# Patient Record
Sex: Male | Born: 1970 | Race: Black or African American | Hispanic: No | Marital: Single | Smoking: Current every day smoker
Health system: Southern US, Community
[De-identification: ages and names within clinical notes are randomized; demographics above are authoritative.]

## PROBLEM LIST (undated history)

## (undated) DIAGNOSIS — E7800 Pure hypercholesterolemia, unspecified: Secondary | ICD-10-CM

## (undated) DIAGNOSIS — E119 Type 2 diabetes mellitus without complications: Secondary | ICD-10-CM

## (undated) DIAGNOSIS — I10 Essential (primary) hypertension: Secondary | ICD-10-CM

## (undated) HISTORY — PX: TONSILLECTOMY: SUR1361

---

## 1999-10-03 ENCOUNTER — Emergency Department (HOSPITAL_COMMUNITY): Admission: EM | Admit: 1999-10-03 | Discharge: 1999-10-03 | Payer: Self-pay

## 1999-10-18 ENCOUNTER — Encounter: Admission: RE | Admit: 1999-10-18 | Discharge: 2000-01-16 | Payer: Self-pay | Admitting: Internal Medicine

## 2005-01-28 ENCOUNTER — Ambulatory Visit: Payer: Self-pay | Admitting: Internal Medicine

## 2005-03-01 ENCOUNTER — Ambulatory Visit: Payer: Self-pay | Admitting: Internal Medicine

## 2005-04-06 ENCOUNTER — Ambulatory Visit: Payer: Self-pay | Admitting: Internal Medicine

## 2005-07-05 ENCOUNTER — Ambulatory Visit: Payer: Self-pay | Admitting: Internal Medicine

## 2005-07-08 ENCOUNTER — Ambulatory Visit: Payer: Self-pay | Admitting: Internal Medicine

## 2005-09-19 ENCOUNTER — Ambulatory Visit: Payer: Self-pay | Admitting: Internal Medicine

## 2006-07-20 ENCOUNTER — Ambulatory Visit: Payer: Self-pay | Admitting: Internal Medicine

## 2007-03-16 DIAGNOSIS — R519 Headache, unspecified: Secondary | ICD-10-CM | POA: Insufficient documentation

## 2007-03-16 DIAGNOSIS — R51 Headache: Secondary | ICD-10-CM

## 2007-03-16 DIAGNOSIS — E119 Type 2 diabetes mellitus without complications: Secondary | ICD-10-CM

## 2007-03-16 DIAGNOSIS — M545 Low back pain: Secondary | ICD-10-CM | POA: Insufficient documentation

## 2007-03-16 DIAGNOSIS — I10 Essential (primary) hypertension: Secondary | ICD-10-CM | POA: Insufficient documentation

## 2007-03-29 ENCOUNTER — Emergency Department (HOSPITAL_COMMUNITY): Admission: EM | Admit: 2007-03-29 | Discharge: 2007-03-29 | Payer: Self-pay | Admitting: Emergency Medicine

## 2007-05-08 ENCOUNTER — Ambulatory Visit: Payer: Self-pay | Admitting: Internal Medicine

## 2007-05-08 LAB — CONVERTED CEMR LAB: Uric Acid, Serum: 9.1 mg/dL — ABNORMAL HIGH (ref 2.4–7.0)

## 2007-06-29 ENCOUNTER — Ambulatory Visit: Payer: Self-pay | Admitting: Internal Medicine

## 2007-06-29 DIAGNOSIS — F411 Generalized anxiety disorder: Secondary | ICD-10-CM | POA: Insufficient documentation

## 2007-06-29 DIAGNOSIS — J069 Acute upper respiratory infection, unspecified: Secondary | ICD-10-CM | POA: Insufficient documentation

## 2007-06-29 DIAGNOSIS — M109 Gout, unspecified: Secondary | ICD-10-CM | POA: Insufficient documentation

## 2007-06-29 LAB — CONVERTED CEMR LAB: Blood Glucose, Fingerstick: 224

## 2007-08-03 ENCOUNTER — Telehealth: Payer: Self-pay | Admitting: Internal Medicine

## 2007-08-27 ENCOUNTER — Telehealth: Payer: Self-pay | Admitting: Internal Medicine

## 2008-01-03 ENCOUNTER — Telehealth: Payer: Self-pay | Admitting: Internal Medicine

## 2008-03-20 ENCOUNTER — Ambulatory Visit: Payer: Self-pay | Admitting: Internal Medicine

## 2008-03-20 DIAGNOSIS — F17200 Nicotine dependence, unspecified, uncomplicated: Secondary | ICD-10-CM | POA: Insufficient documentation

## 2008-03-20 LAB — CONVERTED CEMR LAB: Blood Glucose, Fingerstick: 94

## 2008-04-16 ENCOUNTER — Ambulatory Visit: Payer: Self-pay | Admitting: Internal Medicine

## 2008-04-16 DIAGNOSIS — R55 Syncope and collapse: Secondary | ICD-10-CM

## 2008-04-16 LAB — CONVERTED CEMR LAB: Blood Glucose, Fingerstick: 186

## 2008-06-16 ENCOUNTER — Telehealth: Payer: Self-pay | Admitting: Internal Medicine

## 2008-11-07 ENCOUNTER — Telehealth: Payer: Self-pay | Admitting: Internal Medicine

## 2009-08-13 ENCOUNTER — Ambulatory Visit: Payer: Self-pay | Admitting: Internal Medicine

## 2009-08-14 ENCOUNTER — Encounter: Payer: Self-pay | Admitting: Internal Medicine

## 2009-08-14 LAB — CONVERTED CEMR LAB: Hgb A1c MFr Bld: 7.5 % — ABNORMAL HIGH (ref 4.6–6.5)

## 2009-11-27 ENCOUNTER — Ambulatory Visit: Payer: Self-pay | Admitting: Internal Medicine

## 2009-11-27 LAB — CONVERTED CEMR LAB: Blood Glucose, Fingerstick: 201

## 2009-12-07 LAB — CONVERTED CEMR LAB
Basophils Absolute: 0 10*3/uL (ref 0.0–0.1)
Bilirubin, Direct: 0.1 mg/dL (ref 0.0–0.3)
Calcium: 9.4 mg/dL (ref 8.4–10.5)
HCT: 41.7 % (ref 39.0–52.0)
Hemoglobin: 14.3 g/dL (ref 13.0–17.0)
Hgb A1c MFr Bld: 7.1 % — ABNORMAL HIGH (ref 4.6–6.5)
MCHC: 34.4 g/dL (ref 30.0–36.0)
MCV: 87.5 fL (ref 78.0–100.0)
Neutrophils Relative %: 47.3 % (ref 43.0–77.0)
Potassium: 4 meq/L (ref 3.5–5.1)
RBC: 4.76 M/uL (ref 4.22–5.81)
Sodium: 140 meq/L (ref 135–145)
Total Protein: 7.8 g/dL (ref 6.0–8.3)
Uric Acid, Serum: 10 mg/dL — ABNORMAL HIGH (ref 4.0–7.8)
WBC: 5.1 10*3/uL (ref 4.5–10.5)

## 2010-07-15 NOTE — Assessment & Plan Note (Signed)
Summary: cpx/pt will come in fasting/njr   Vital Signs:  Patient profile:   40 year old male Height:      67 inches Weight:      272 pounds BMI:     42.76 Temp:     98.9 degrees F oral BP sitting:   130 / 80  (right arm) Cuff size:   large  Vitals Entered By: Duard Brady LPN (August 13, 1608 9:43 AM) CC: cpx - doing ok , c/o (R) shoulder pain    bs 115-130's   fasting for labs this AM Is Patient Diabetic? Yes   CC:  cpx - doing ok  and c/o (R) shoulder pain    bs 115-130's   fasting for labs this AM.  History of Present Illness: 40 year old patient who is seen today for a wellness exam.  He has a long history of type 2 diabetes.  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  Medical problems include hypertension, type 2 diabetes, and history of gout.  He is ongoing tobacco use.  Is on will complain is a persistent right shoulder pain.  He felt that he injured this about one year ago with weight training and pain has persisted.  He feels his diabetes is under excellent control.  He is often seen over one year, but his last hemoglobin  A1c was 6.3  Preventive Screening-Counseling & Management  Alcohol-Tobacco     Smoking Status: current  Allergies (verified): No Known Drug Allergies  Past History:  Past Medical History: Reviewed history from 03/20/2008 and no changes required. Diabetes mellitus, type II Headache Hypertension Low back pain Obesity  tobacco use  Past Surgical History: Reviewed history from 03/16/2007 and no changes required. Denies surgical history  Family History: Reviewed history and no changes required. Father- 40 Mother- 64 Suprnuclear palsy  ! sister  Social History: Reviewed history from 03/16/2007 and no changes required. Occupation: unemployed Married 56-year-old daughter Current Smoker Smoking Status:  current  Review of Systems  The patient denies anorexia, fever, weight loss, weight gain, vision loss, decreased  hearing, hoarseness, chest pain, syncope, dyspnea on exertion, peripheral edema, prolonged cough, headaches, hemoptysis, abdominal pain, melena, hematochezia, severe indigestion/heartburn, hematuria, incontinence, genital sores, muscle weakness, suspicious skin lesions, transient blindness, difficulty walking, depression, unusual weight change, abnormal bleeding, enlarged lymph nodes, angioedema, breast masses, and testicular masses.    Physical Exam  General:  130/80overweight-appearing.  overweight-appearing and underweight appearing.   Head:  Normocephalic and atraumatic without obvious abnormalities. No apparent alopecia or balding. Eyes:  No corneal or conjunctival inflammation noted. EOMI. Perrla. Funduscopic exam benign, without hemorrhages, exudates or papilledema. Vision grossly normal. Ears:  External ear exam shows no significant lesions or deformities.  Otoscopic examination reveals clear canals, tympanic membranes are intact bilaterally without bulging, retraction, inflammation or discharge. Hearing is grossly normal bilaterally. Nose:  External nasal examination shows no deformity or inflammation. Nasal mucosa are pink and moist without lesions or exudates. Mouth:  Oral mucosa and oropharynx without lesions or exudates.  Teeth in good repair. Neck:  No deformities, masses, or tenderness noted. Chest Wall:  No deformities, masses, tenderness or gynecomastia noted. Breasts:  No masses or gynecomastia noted Lungs:  Normal respiratory effort, chest expands symmetrically. Lungs are clear to auscultation, no crackles or wheezes. Heart:  Normal rate and regular rhythm. S1 and S2 normal without gallop, murmur, click, rub or other extra sounds. Abdomen:  Bowel sounds positive,abdomen soft and non-tender without masses, organomegaly or hernias  noted. Genitalia:  Testes bilaterally descended without nodularity, tenderness or masses. No scrotal masses or lesions. No penis lesions or urethral  discharge. Msk:  No deformity or scoliosis noted of thoracic or lumbar spine.   Pulses:  R and L carotid,radial,femoral,dorsalis pedis and posterior tibial pulses are full and equal bilaterally Extremities:  No clubbing, cyanosis, edema, or deformity noted with normal full range of motion of all joints.   Neurologic:  No cranial nerve deficits noted. Station and gait are normal. Plantar reflexes are down-going bilaterally. DTRs are symmetrical throughout. Sensory, motor and coordinative functions appear intact. Skin:  Intact without suspicious lesions or rashes Cervical Nodes:  No lymphadenopathy noted Axillary Nodes:  No palpable lymphadenopathy Inguinal Nodes:  No significant adenopathy Psych:  Cognition and judgment appear intact. Alert and cooperative with normal attention span and concentration. No apparent delusions, illusions, hallucinations  Diabetes Management Exam:    Foot Exam (with socks and/or shoes not present):       Sensory-Pinprick/Light touch:          Left medial foot (L-4): normal          Left dorsal foot (L-5): normal          Left lateral foot (S-1): normal          Right medial foot (L-4): normal          Right dorsal foot (L-5): normal          Right lateral foot (S-1): normal       Sensory-Monofilament:          Left foot: normal          Right foot: normal       Inspection:          Left foot: normal          Right foot: normal       Nails:          Left foot: normal          Right foot: normal    Foot Exam by Podiatrist:       Date: 08/13/2009       Results: no diabetic findings       Done by: PCP    Eye Exam:       Eye Exam done here today          Results: normal   Impression & Recommendations:  Problem # 1:  TOBACCO ABUSE (ICD-305.1)  His updated medication list for this problem includes:    Chantix Starting Month Pak 0.5 Mg X 11 & 1 Mg X 42 Misc (Varenicline tartrate) .Marland Kitchen... As directed    Chantix 1 Mg Tabs (Varenicline tartrate) ..... One  twice daily  His updated medication list for this problem includes:    Chantix Starting Month Pak 0.5 Mg X 11 & 1 Mg X 42 Misc (Varenicline tartrate) .Marland Kitchen... As directed    Chantix 1 Mg Tabs (Varenicline tartrate) ..... One twice daily  Problem # 2:  HYPERTENSION (ICD-401.9)  The following medications were removed from the medication list:    Benazepril-hydrochlorothiazide 20-12.5 Mg Tabs (Benazepril-hydrochlorothiazide) .Marland Kitchen... 1 once daily His updated medication list for this problem includes:    Lisinopril-hydrochlorothiazide 20-25 Mg Tabs (Lisinopril-hydrochlorothiazide) ..... One daily  His updated medication list for this problem includes:    Benazepril-hydrochlorothiazide 20-12.5 Mg Tabs (Benazepril-hydrochlorothiazide) .Marland Kitchen... 1 once daily  Problem # 3:  DIABETES MELLITUS, TYPE II (ICD-250.00)  The following medications were removed from the medication list:  Benazepril-hydrochlorothiazide 20-12.5 Mg Tabs (Benazepril-hydrochlorothiazide) .Marland Kitchen... 1 once daily His updated medication list for this problem includes:    Glucophage Xr 500 Mg Tb24 (Metformin hcl) .Marland KitchenMarland KitchenMarland KitchenMarland Kitchen 1bid    Glipizide Xl 10 Mg Tb24 (Glipizide) ..... One daily    Glucophage Xr 500 Mg Tb24 (Metformin hcl) .Marland Kitchen... 2 two times a day by mouth    Lisinopril-hydrochlorothiazide 20-25 Mg Tabs (Lisinopril-hydrochlorothiazide) ..... One daily  His updated medication list for this problem includes:    Benazepril-hydrochlorothiazide 20-12.5 Mg Tabs (Benazepril-hydrochlorothiazide) .Marland Kitchen... 1 once daily    Glucophage Xr 500 Mg Tb24 (Metformin hcl) .Marland KitchenMarland KitchenMarland KitchenMarland Kitchen 1bid    Glipizide Xl 10 Mg Tb24 (Glipizide) ..... One daily    Glucophage Xr 500 Mg Tb24 (Metformin hcl) .Marland Kitchen... 2 two times a day by mouth  Orders: Venipuncture (04540) TLB-A1C / Hgb A1C (Glycohemoglobin) (83036-A1C)  Problem # 4:  Preventive Health Care (ICD-V70.0)  Complete Medication List: 1)  Glucophage Xr 500 Mg Tb24 (Metformin hcl) .Marland Kitchen.. 1bid 2)  Allopurinol 100 Mg Tabs  (Allopurinol) .Marland Kitchen.. 1 once daily 3)  Glipizide Xl 10 Mg Tb24 (Glipizide) .... One daily 4)  Hydrocodone-acetaminophen 7.5-750 Mg Tabs (Hydrocodone-acetaminophen) .... One every 6 hours for pain 5)  Zolpidem Tartrate 10 Mg Tabs (Zolpidem tartrate) .... One at bedtime as needed 6)  Glucophage Xr 500 Mg Tb24 (Metformin hcl) .... 2 two times a day by mouth 7)  Chantix Starting Month Pak 0.5 Mg X 11 & 1 Mg X 42 Misc (Varenicline tartrate) .... As directed 8)  Chantix 1 Mg Tabs (Varenicline tartrate) .... One twice daily 9)  Lisinopril-hydrochlorothiazide 20-25 Mg Tabs (Lisinopril-hydrochlorothiazide) .... One daily  Patient Instructions: 1)  Please schedule a follow-up appointment in 4 months. 2)  Limit your Sodium (Salt). 3)  It is important that you exercise regularly at least 20 minutes 5 times a week. If you develop chest pain, have severe difficulty breathing, or feel very tired , stop exercising immediately and seek medical attention. 4)  You need to lose weight. Consider a lower calorie diet and regular exercise.  5)  Check your blood sugars regularly. If your readings are usually above : or below 70 you should contact our office. 6)  It is important that your Diabetic A1c level is checked every 3 months. 7)  See your eye doctor yearly to check for diabetic eye damage. Prescriptions: LISINOPRIL-HYDROCHLOROTHIAZIDE 20-25 MG TABS (LISINOPRIL-HYDROCHLOROTHIAZIDE) one daily Brand medically necessary #90 x 6   Entered and Authorized by:   Gordy Savers  MD   Signed by:   Gordy Savers  MD on 08/13/2009   Method used:   Print then Give to Patient   RxID:   [id number] GLUCOPHAGE XR 500 MG  TB24 (METFORMIN HCL) 2 two times a day by mouth  #360 x 6   Entered and Authorized by:   Gordy Savers  MD   Signed by:   Gordy Savers  MD on 08/13/2009   Method used:   Print then Give to Patient   RxID:   [id number] GLIPIZIDE XL 10 MG  TB24 (GLIPIZIDE) one daily  #90 x  6   Entered and Authorized by:   Gordy Savers  MD   Signed by:   Gordy Savers  MD on 08/13/2009   Method used:   Print then Give to Patient   RxID:   [id number] ALLOPURINOL 100 MG  TABS (ALLOPURINOL) 1 once daily  #90 x 6   Entered and Authorized by:   Theron Arista  Lysle Dingwall  MD   Signed by:   Gordy Savers  MD on 08/13/2009   Method used:   Print then Give to Patient   RxID:   [id number] GLUCOPHAGE XR 500 MG TB24 (METFORMIN HCL) 1bid  #180 x 6   Entered and Authorized by:   Gordy Savers  MD   Signed by:   Gordy Savers  MD on 08/13/2009   Method used:   Print then Give to Patient   RxID:   [id number]

## 2010-07-15 NOTE — Assessment & Plan Note (Signed)
Summary: fatigue/letharagy/dm   Vital Signs:  Patient profile:   40 year old male Weight:      259 pounds Temp:     98.3 degrees F oral BP sitting:   130 / 80  (left arm) Cuff size:   large  Vitals Entered By: Kathrynn Speed CMA (November 27, 2009 10:24 AM) CC: fatigue / letharagy CBG Result 201   CC:  fatigue / letharagy.  History of Present Illness: 33 -year-old patient who has a history of type 2 diabetes.  He has a history also of hypertension and gout.  For the past couple weeks.  He has had some increasing fatigue and lack of energy he has lost some weight since his last visit here.  He does do outdoor yard work in the summer environment and he is having a more difficult time perform an is heavy exertion.  his blood pressure has remained under nice control.  His been no history of gout.  Current Medications (verified): 1)  Glucophage Xr 500 Mg Tb24 (Metformin Hcl) .... 2 By Mouth Bid 2)  Allopurinol 100 Mg  Tabs (Allopurinol) .Marland Kitchen.. 1 Once Daily 3)  Glipizide Xl 10 Mg  Tb24 (Glipizide) .... One Daily 4)  Hydrocodone-Acetaminophen 7.5-750 Mg  Tabs (Hydrocodone-Acetaminophen) .... One Every 6 Hours For Pain 5)  Zolpidem Tartrate 10 Mg  Tabs (Zolpidem Tartrate) .... One At Bedtime As Needed 6)  Glucophage Xr 500 Mg  Tb24 (Metformin Hcl) .... 2 Two Times A Day By Mouth 7)  Chantix Starting Month Pak 0.5 Mg X 11 & 1 Mg X 42 Misc (Varenicline Tartrate) .... As Directed 8)  Chantix 1 Mg Tabs (Varenicline Tartrate) .... One Twice Daily 9)  Lisinopril-Hydrochlorothiazide 20-25 Mg Tabs (Lisinopril-Hydrochlorothiazide) .... One Daily  Allergies (verified): No Known Drug Allergies  Past History:  Past Medical History: Reviewed history from 03/20/2008 and no changes required. Diabetes mellitus, type II Headache Hypertension Low back pain Obesity  tobacco use  Past Surgical History: Reviewed history from 03/16/2007 and no changes required. Denies surgical history  Review of  Systems       The patient complains of weight loss and muscle weakness.  The patient denies anorexia, fever, weight gain, vision loss, decreased hearing, hoarseness, chest pain, syncope, dyspnea on exertion, peripheral edema, prolonged cough, headaches, hemoptysis, abdominal pain, melena, hematochezia, severe indigestion/heartburn, hematuria, incontinence, genital sores, suspicious skin lesions, transient blindness, difficulty walking, depression, unusual weight change, abnormal bleeding, enlarged lymph nodes, angioedema, breast masses, and testicular masses.    Physical Exam  General:  overweight-appearing.  normal blood pressure, no distress Head:  Normocephalic and atraumatic without obvious abnormalities. No apparent alopecia or balding. Eyes:  No corneal or conjunctival inflammation noted. EOMI. Perrla. Funduscopic exam benign, without hemorrhages, exudates or papilledema. Vision grossly normal. Ears:  External ear exam shows no significant lesions or deformities.  Otoscopic examination reveals clear canals, tympanic membranes are intact bilaterally without bulging, retraction, inflammation or discharge. Hearing is grossly normal bilaterally. Mouth:  Oral mucosa and oropharynx without lesions or exudates.  Teeth in good repair. Neck:  No deformities, masses, or tenderness noted. Lungs:  Normal respiratory effort, chest expands symmetrically. Lungs are clear to auscultation, no crackles or wheezes. Heart:  Normal rate and regular rhythm. S1 and S2 normal without gallop, murmur, click, rub or other extra sounds. Abdomen:  Bowel sounds positive,abdomen soft and non-tender without masses, organomegaly or hernias noted. Msk:  No deformity or scoliosis noted of thoracic or lumbar spine.   Pulses:  R  and L carotid,radial,femoral,dorsalis pedis and posterior tibial pulses are full and equal bilaterally Extremities:  No clubbing, cyanosis, edema, or deformity noted with normal full range of motion of  all joints.     Impression & Recommendations:  Problem # 1:  HYPERTENSION (ICD-401.9)  His updated medication list for this problem includes:    Lisinopril-hydrochlorothiazide 20-25 Mg Tabs (Lisinopril-hydrochlorothiazide) ..... One daily  Orders: Venipuncture (16109) TLB-BMP (Basic Metabolic Panel-BMET) (80048-METABOL) TLB-CBC Platelet - w/Differential (85025-CBCD) TLB-Hepatic/Liver Function Pnl (80076-HEPATIC)  Problem # 2:  DIABETES MELLITUS, TYPE II (ICD-250.00)  His updated medication list for this problem includes:    Glucophage Xr 500 Mg Tb24 (Metformin hcl) .Marland Kitchen... 2 by mouth bid    Glipizide Xl 10 Mg Tb24 (Glipizide) ..... One daily    Glucophage Xr 500 Mg Tb24 (Metformin hcl) .Marland Kitchen... 2 two times a day by mouth    Lisinopril-hydrochlorothiazide 20-25 Mg Tabs (Lisinopril-hydrochlorothiazide) ..... One daily  Orders: Capillary Blood Glucose/CBG (60454) Venipuncture (09811) TLB-BMP (Basic Metabolic Panel-BMET) (80048-METABOL) TLB-CBC Platelet - w/Differential (85025-CBCD) TLB-Hepatic/Liver Function Pnl (80076-HEPATIC) TLB-TSH (Thyroid Stimulating Hormone) (84443-TSH) TLB-A1C / Hgb A1C (Glycohemoglobin) (83036-A1C)  Problem # 3:  ANXIETY STATE, UNSPECIFIED (ICD-300.00)  Complete Medication List: 1)  Glucophage Xr 500 Mg Tb24 (Metformin hcl) .... 2 by mouth bid 2)  Allopurinol 100 Mg Tabs (Allopurinol) .Marland Kitchen.. 1 once daily 3)  Glipizide Xl 10 Mg Tb24 (Glipizide) .... One daily 4)  Hydrocodone-acetaminophen 7.5-750 Mg Tabs (Hydrocodone-acetaminophen) .... One every 6 hours for pain 5)  Zolpidem Tartrate 10 Mg Tabs (Zolpidem tartrate) .... One at bedtime as needed 6)  Glucophage Xr 500 Mg Tb24 (Metformin hcl) .... 2 two times a day by mouth 7)  Chantix Starting Month Pak 0.5 Mg X 11 & 1 Mg X 42 Misc (Varenicline tartrate) .... As directed 8)  Chantix 1 Mg Tabs (Varenicline tartrate) .... One twice daily 9)  Lisinopril-hydrochlorothiazide 20-25 Mg Tabs  (Lisinopril-hydrochlorothiazide) .... One daily  Other Orders: TLB-Uric Acid, Blood (84550-URIC)  Patient Instructions: 1)  Please schedule a follow-up appointment in 3 months. 2)  It is important that you exercise regularly at least 20 minutes 5 times a week. If you develop chest pain, have severe difficulty breathing, or feel very tired , stop exercising immediately and seek medical attention. 3)  You need to lose weight. Consider a lower calorie diet and regular exercise.

## 2010-07-15 NOTE — Miscellaneous (Signed)
Summary: increase in med  Clinical Lists Changes  Medications: Changed medication from GLUCOPHAGE XR 500 MG TB24 (METFORMIN HCL) 1bid to GLUCOPHAGE XR 500 MG TB24 (METFORMIN HCL) 2 by mouth bid

## 2010-08-29 ENCOUNTER — Other Ambulatory Visit: Payer: Self-pay | Admitting: Internal Medicine

## 2010-09-05 ENCOUNTER — Other Ambulatory Visit: Payer: Self-pay | Admitting: Internal Medicine

## 2010-10-06 ENCOUNTER — Other Ambulatory Visit: Payer: Self-pay | Admitting: Internal Medicine

## 2010-12-15 ENCOUNTER — Emergency Department (HOSPITAL_COMMUNITY): Payer: Self-pay

## 2010-12-15 ENCOUNTER — Observation Stay (HOSPITAL_COMMUNITY)
Admission: EM | Admit: 2010-12-15 | Discharge: 2010-12-16 | Disposition: A | Discharge status: Home / Self Care | Payer: Self-pay | Attending: Internal Medicine | Admitting: Internal Medicine

## 2010-12-15 ENCOUNTER — Encounter (HOSPITAL_COMMUNITY): Payer: Self-pay

## 2010-12-15 DIAGNOSIS — N390 Urinary tract infection, site not specified: Secondary | ICD-10-CM | POA: Insufficient documentation

## 2010-12-15 DIAGNOSIS — E669 Obesity, unspecified: Secondary | ICD-10-CM | POA: Insufficient documentation

## 2010-12-15 DIAGNOSIS — E119 Type 2 diabetes mellitus without complications: Secondary | ICD-10-CM | POA: Insufficient documentation

## 2010-12-15 DIAGNOSIS — M109 Gout, unspecified: Secondary | ICD-10-CM | POA: Insufficient documentation

## 2010-12-15 DIAGNOSIS — Z79899 Other long term (current) drug therapy: Secondary | ICD-10-CM | POA: Insufficient documentation

## 2010-12-15 DIAGNOSIS — F17200 Nicotine dependence, unspecified, uncomplicated: Secondary | ICD-10-CM | POA: Insufficient documentation

## 2010-12-15 DIAGNOSIS — R0789 Other chest pain: Principal | ICD-10-CM | POA: Insufficient documentation

## 2010-12-15 DIAGNOSIS — R072 Precordial pain: Secondary | ICD-10-CM

## 2010-12-15 DIAGNOSIS — K219 Gastro-esophageal reflux disease without esophagitis: Secondary | ICD-10-CM | POA: Insufficient documentation

## 2010-12-15 DIAGNOSIS — I10 Essential (primary) hypertension: Secondary | ICD-10-CM | POA: Insufficient documentation

## 2010-12-15 LAB — LIPID PANEL
LDL Cholesterol: 89 mg/dL (ref 0–99)
Total CHOL/HDL Ratio: 5.1 RATIO
VLDL: 38 mg/dL (ref 0–40)

## 2010-12-15 LAB — TROPONIN I: Troponin I: <0.3 ng/mL (ref <0.30)

## 2010-12-15 LAB — CBC
HCT: 41.9 % (ref 39.0–52.0)
Hemoglobin: 14.6 g/dL (ref 13.0–17.0)
MCV: 82.6 fL (ref 78.0–100.0)
RBC: 5.07 MIL/uL (ref 4.22–5.81)
WBC: 7.9 10*3/uL (ref 4.0–10.5)

## 2010-12-15 LAB — MAGNESIUM: Magnesium: 2 mg/dL (ref 1.5–2.5)

## 2010-12-15 LAB — URINALYSIS, ROUTINE W REFLEX MICROSCOPIC
Glucose, UA: NEGATIVE mg/dL
Specific Gravity, Urine: 1.02 (ref 1.005–1.030)
Urobilinogen, UA: 1 mg/dL (ref 0.0–1.0)

## 2010-12-15 LAB — CARDIAC PANEL(CRET KIN+CKTOT+MB+TROPI)
CK, MB: 1.5 ng/mL (ref 0.3–4.0)
Total CK: 163 U/L (ref 7–232)
Troponin I: <0.3 ng/mL (ref <0.30)

## 2010-12-15 LAB — GLUCOSE, CAPILLARY
Glucose-Capillary: 103 mg/dL — ABNORMAL HIGH (ref 70–99)
Glucose-Capillary: 129 mg/dL — ABNORMAL HIGH (ref 70–99)
Glucose-Capillary: 82 mg/dL (ref 70–99)

## 2010-12-15 LAB — HEMOGLOBIN A1C: Mean Plasma Glucose: 143 mg/dL — ABNORMAL HIGH (ref <117)

## 2010-12-15 LAB — URINE MICROSCOPIC-ADD ON

## 2010-12-15 LAB — DIFFERENTIAL
Eosinophils Absolute: 0.3 10*3/uL (ref 0.0–0.7)
Eosinophils Relative: 3 % (ref 0–5)
Lymphocytes Relative: 38 % (ref 12–46)
Lymphs Abs: 3 10*3/uL (ref 0.7–4.0)
Monocytes Relative: 7 % (ref 3–12)

## 2010-12-15 LAB — RAPID URINE DRUG SCREEN, HOSP PERFORMED: Opiates: POSITIVE — AB

## 2010-12-15 LAB — BASIC METABOLIC PANEL
CO2: 24 mEq/L (ref 19–32)
Calcium: 9.6 mg/dL (ref 8.4–10.5)
GFR calc non Af Amer: >60 mL/min (ref >60)
Glucose, Bld: 102 mg/dL — ABNORMAL HIGH (ref 70–99)
Potassium: 3.7 mEq/L (ref 3.5–5.1)

## 2010-12-15 LAB — CK TOTAL AND CKMB (NOT AT ARMC): Relative Index: 0.8 (ref 0.0–2.5)

## 2010-12-15 LAB — PHOSPHORUS: Phosphorus: 4 mg/dL (ref 2.3–4.6)

## 2010-12-15 LAB — TSH: TSH: 3.879 u[IU]/mL (ref 0.350–4.500)

## 2010-12-16 LAB — COMPREHENSIVE METABOLIC PANEL
ALT: 22 U/L (ref 0–53)
Albumin: 3.2 g/dL — ABNORMAL LOW (ref 3.5–5.2)
Alkaline Phosphatase: 37 U/L — ABNORMAL LOW (ref 39–117)
BUN: 15 mg/dL (ref 6–23)
Potassium: 4 mEq/L (ref 3.5–5.1)
Sodium: 136 mEq/L (ref 135–145)
Total Protein: 6.4 g/dL (ref 6.0–8.3)

## 2010-12-16 LAB — CBC
MCHC: 33.9 g/dL (ref 30.0–36.0)
RDW: 13 % (ref 11.5–15.5)

## 2010-12-16 LAB — GLUCOSE, CAPILLARY: Glucose-Capillary: 158 mg/dL — ABNORMAL HIGH (ref 70–99)

## 2010-12-16 LAB — URINE CULTURE: Culture  Setup Time: 201207041945

## 2010-12-23 NOTE — Discharge Summary (Addendum)
Brandon James, Brandon James NO.:  [id number]  MEDICAL RECORD NO.:  [id number]  LOCATION:  1403                         FACILITY:  [facility]  PHYSICIAN:  Erick Blinks, MD     DATE OF BIRTH:  March 19, 1971  DATE OF ADMISSION:  12/15/2010 DATE OF DISCHARGE:  12/16/2010                              DISCHARGE SUMMARY   PRIMARY CARE PROVIDER:  Gordy Savers, MD  DISCHARGE DIAGNOSES: 1. Chest pain in patient with risk factors, i.e., diabetes,     hypertension, obesity, tobacco use. 2. Diabetes type 2, controlled. 3. Hypertension, controlled. 4. Urinary tract infection. 5. Tobacco use. 6. Gastroesophageal reflux disease. 7. Gout.  DIAGNOSTIC LABS:  WBC 7.9, hemoglobin 14.6, hematocrit 41.9, platelets 252,000.  Sodium 136, potassium 3.7, chloride 97, CO2 24, BUN 18, creatinine 1.17, glucose 102.  Urinalysis with moderate leukocytes, positive nitrites, moderate blood, cloudy in appearance, many bacteria and 21 to 50 wbc's with wbc clumps.  First set of cardiac enzymes yield total CK 196, CK-MB 1.5, troponin I less than 0.30.  Magnesium level 2.0, phosphorus 4.0.  Pro BNP 5.0.  Second set of cardiac enzymes, total CK 163, CK-MB 1.5, troponin I less than 0.30.  Third set of cardiac enzymes yield a total CK 164, CK-MB 1.5, troponin I less than 0.30. Cholesterol 158, triglycerides 189, HDL 31, LDL 89, VLDL 38.  Hemoglobin A1c is 6.6.  TSH is 3.87.  EKG shows normal sinus rhythm at 81.  DIAGNOSTIC IMAGING: 1. Chest x-ray on admission yields no active disease. 2. CT of the head on admission shows no acute intracranial     abnormality. 3. 2-D echo on July 4 yields ejection fraction of 55% to 65%.  CONSULTATIONS:  Dr. Kerry Hough discussed case with Dr. Elease Hashimoto on July 4 and it was determined that the patient could have a stress test done on an outpatient basis.  PROCEDURES:  None.  BRIEF HISTORY:  Brandon James is a 40 year old with a history of diabetes, hypertension,  obesity, tobacco use, who developed headache and slight chest pain 3 days prior to his presentation to the [facility] ED on July 4.  He indicated that the pain started intermittently ranging from a 7 to 8 on a scale of 10, mainly in the left chest.  He described the pain as a pressure and associated it with a headache that was bilateral. He also experienced mild shortness of breath during these episodes.  The patient also reported some dysuria but no hematuria.  He denied any fever or chills but did indicate that he generally felt weak.  There was no nausea, vomiting, diarrhea.  He reported the chest pain was centrally located with no radiation.  [facility] was asked to admit for further evaluation and treatment.  HOSPITAL COURSE BY PROBLEM: 1. Chest pain.  The patient was admitted to telemetry unit on     observation.  He was given morphine and aspirin.  He got relief     from his chest pain and had no further recurrences of chest pain or     shortness of breath during his hospitalization.  His EKG remained in sinus rhythm.  Dr. Kerry Hough spoke with Dr. Elease Hashimoto regarding Mr.  James and it was determined that he could be discharged and have a     stress test on an outpatient basis.  [facility] has been     contacted and they will call Brandon James on July 6 to set up the     outpatient stress test.  His cardiac enzymes were all negative as     well. 2. Diabetes.  Hemoglobin A1c is 6.6, indicating fairly good control.     The patient's metformin and glipizide were held overnight and a     sliding scale insulin was used.  The patient will resume his home     metformin and glipizide.  He will continue to follow up with Dr.     Amador Cunas for management of his diabetes.  His blood sugar stayed     well controlled during his hospitalization. 3. Hypertension, controlled.  The patient was continued on his     lisinopril and will be so continued at discharge. 4. Urinary tract  infection.  The patient received Rocephin IV.  He     will be discharged on Cipro p.o. to complete 3-day course. 5. GERD.  The patient was started on Protonix and will be discharged     on same. 6. Tobacco use.  The patient was provided with a nicotine patch.  He     will be given a prescription for same, as he verbalized wish to     stop smoking. 7. Gout, remained at baseline.  The patient is to continue his     allopurinol on discharge.  DISCHARGE MEDICATIONS: 1. Cipro 250 mg p.o. b.i.d. for 3 days. 2. Nicotine 21 mg/24-hour patch transdermally daily. 3. Protonix 40 mg p.o. daily. 4. Allopurinol 100 mg p.o. daily. 5. Glipizide XL 10 mg p.o. daily. 6. Metformin XR 500 mg 2 tablets p.o. b.i.d. 7. Prinzide 20/25 mg p.o. daily.  ACTIVITY:  No restrictions.  DIET:  Continue his carb-modified, heart-healthy diet.  FOLLOWUP:  [facility] has been contacted and will arrange for outpatient stress test.  They will call Brandon James on July 6 and notify him of those arrangements.  PHYSICAL EXAMINATION:  VITAL SIGNS:  Temperature 97.9, blood pressure 100/65, heart rate 73, respiratory rate 18, sats 97% on room air. GENERAL:  Awake, alert, ambulating in room, no acute distress. CV:  Regular rate and rhythm.  No murmur, gallop or rub.  Trace lower extremity edema.  Pedal pulses present and palpable. RESPIRATORY:  Normal effort.  Breath sounds clear to auscultation bilaterally.  No rhonchi, wheezes or rales. ABDOMEN:  Obese, soft, positive bowel sounds throughout, nontender to palpation. NEUROLOGIC:  Alert and oriented x3.  Speech clear.  Facial symmetry.  DISPOSITION:  The patient is medically stable and able to be discharged home.  Time spent on discharge 40 minutes.     Gwenyth Bender, NP   ______________________________ Erick Blinks, MD    KMB/MEDQ  D:  12/16/2010  T:  12/16/2010  Job:  [id number]  Electronically Signed by Toya Smothers  on 12/23/2010 07:47:27  AM Electronically Signed by Durward Mallard MEMON  on 01/05/2011 11:48:02 PM

## 2011-01-05 NOTE — Discharge Summary (Signed)
  Brandon James, Brandon James NO.:  [id number]  MEDICAL RECORD NO.:  [id number]  LOCATION:  1403                         FACILITY:  [facility]  PHYSICIAN:  Erick Blinks, MD     DATE OF BIRTH:  08/12/1970  DATE OF ADMISSION:  12/15/2010 DATE OF DISCHARGE:                              DISCHARGE SUMMARY   ADDENDUM:  DISCHARGE MEDICATIONS:  Updated discharge medication list is as follows: 1. Aspirin 81 mg daily. 2. Ciprofloxacin 500 mg p.o. b.i.d. 3. Nicotine 21 mg patch transdermally daily. 4. Protonix 40 mg p.o. daily. 5. Allopurinol 100 mg daily. 6. Glipizide XL 10 mg p.o. daily. 7. Metformin XR 500 mg 2 tablets by mouth twice daily. 8. Prinzide 20/25 mg 1 tablet by mouth daily.  DISCHARGE DIAGNOSES: 1. Atypical chest pain, ruled out for acute coronary syndrome,     possibly related to gastroesophageal reflux disease, for outpatient     followup. 2. Urinary tract infection. 3. History of gout. 4. Gastroesophageal reflux disease. 5. Type 2 diabetes. 6. Hypertension. 7. Obesity. 8. Tobacco abuse.  The patient is ruled out for acute coronary syndrome with negative cardiac enzymes.  His EKG is normal.  He has no further pain.  Case was discussed with [facility] who has felt appropriate to discharge the patient and have him followup with an outpatient stress test.  The patient will be discharged back to the care of his primary care physician.  He is stable for discharge.  CONDITION AT TIME OF DISCHARGE:  Improved.    Erick Blinks, MD    JM/MEDQ  D:  12/16/2010  T:  12/16/2010  Job:  [id number]  Electronically Signed by Durward Mallard Constance Whittle  on 01/05/2011 11:48:50 PM

## 2011-02-18 ENCOUNTER — Ambulatory Visit: Payer: Self-pay

## 2011-03-06 ENCOUNTER — Other Ambulatory Visit: Payer: Self-pay | Admitting: Internal Medicine

## 2011-03-23 LAB — POCT CARDIAC MARKERS: Troponin i, poc: <0.05

## 2011-03-23 LAB — COMPREHENSIVE METABOLIC PANEL
ALT: 48
AST: 33
Albumin: 4.2
Alkaline Phosphatase: 39
BUN: 15
Chloride: 100
Potassium: 3.8
Sodium: 136
Total Bilirubin: 1.1

## 2011-03-26 NOTE — H&P (Signed)
Brandon James, COLINA NO.:  [id number]  MEDICAL RECORD NO.:  [id number]  LOCATION:  [facility]LED                         FACILITY:  [facility]  PHYSICIAN:  Lonia Blood, M.D.      DATE OF BIRTH:  05/12/1971  DATE OF ADMISSION:  12/15/2010 DATE OF DISCHARGE:                             HISTORY & PHYSICAL   PRIMARY CARE PHYSICIAN:  Gordy Savers, MD  PRESENTING COMPLAINT:  Chest pain.  HISTORY OF PRESENT ILLNESS:  The patient is a 40 year old gentleman with history of diabetes, hypertension, and obesity who apparently has been doing all right until the last 3 days.  Since Saturday actually he started having headache and was having slight chest pain.  The chest pain started getting intermittent ranging from 7-8/10, mainly in the left chest, mainly pressure.  Associated with that is headache, which is bilateral.  He had some mild shortness of breath during the episodes. He has also felt some dysuria, but no hematuria.  Denied any fever or chills, but he is generally feeling weak.  He denied any nausea, vomiting, or diarrhea.  No diaphoresis.  The chest pain is centrally located and no radiation.  He denied any aggravating or relieving factors.  PAST MEDICAL HISTORY:  Significant for: 1. Diabetes. 2. Gout. 3. Hypertension. 4. Prior chest pain over 5 years ago for which he had a stress test     that was negative. 5. Morbid obesity.  ALLERGIES:  He has no known drug allergies.  MEDICATIONS:  Include: 1. Metformin 1000 mg daily. 2. Lisinopril 10 mg daily. 3. Allopurinol 300 mg daily. 4. Glipizide 20 mg daily.  SOCIAL HISTORY:  The patient lives in [place] with his girlfriend. He smokes about half to one pack per day.  Occasional alcohol and no IV drug use.  FAMILY HISTORY:  Significant for diabetes and hypertension.  Denied any family history of early coronary artery disease.  REVIEW OF SYSTEMS:  All systems reviewed are negative except per  HPI.  PHYSICAL EXAMINATION:  VITAL SIGNS:  Temperature 98.6, blood pressure is 129/84 with a pulse 84, respiratory rate 18, and sats 97% on room air. GENERAL:  He is awake, alert, oriented, pleasant man who is in no acute distress. HEENT:  PERRL.  EOMI.  No pallor, no jaundice, no rhinorrhea. NECK:  Supple.  No visible JVD.  No lymphadenopathy. RESPIRATORY:  There is good air entry bilaterally.  No wheezing, no rales, no crackles. CARDIOVASCULAR:  S1 and S2.  No murmur. ABDOMEN:  Obese, soft, nontender with positive bowel sounds. EXTREMITIES:  Show no edema, cyanosis, or clubbing. SKIN:  No rashes or ulcers. MUSCULOSKELETAL:  No joint swelling or tenderness.  LABORATORY DATA:  White count is 7.9, hemoglobin 14.6 with platelet of 252, and normal differentials.  Urinalysis showed cloudy urine with some moderate hemoglobin, nitrite positive, leukocyte esterase positive. Urine wbc's 29-50 and many bacteria.  Sodium was 136, potassium 3.7, chloride 97, CO2 of 24, glucose 102, BUN 18, creatinine 1.17 with a calcium of 9.6.  Initial cardiac enzymes are negative.  Chest x-ray shows no acute disease.  CT head without contrast showed no intracranial abnormalities.  His EKG showed normal sinus rhythm with a rate  of 81 and no significant ST-T wave changes.  ASSESSMENT:  This is a 40 year old gentleman with risk factors for coronary artery disease including diabetes, hypertension, unknown cholesterol, obesity, and tobacco abuse presenting with chest pain.  Due to the patient's significant risk factors, we will admit him for myocardial infarction rule out.  PLAN: 1. Chest pain.  Admit the patient for observation, check serial     cardiac enzymes, sublingual nitroglycerin, morphine and oxygen,     given some aspirin.  If enzymes are negative, the patient is a     candidate for repeat stress testing.  I will also consider empiric     treatment for GERD as a possible differential. 2. Diabetes.   I will hold the metformin, but continue with the     glipizide and sliding scale insulin. 3. Hypertension.  Continue with his lisinopril. 4. History of gout.  We will continue with allopurinol. 5. Tobacco abuse.  I will give nicotine patch and tobacco cessation     counseling. 6. UTI.  We will send for urine culture and sensitivity and start him     on empiric Rocephin. 7. Obesity.  The patient will need nutrition counseling.     Lonia Blood, M.D.     Verlin Grills  D:  12/15/2010  T:  12/15/2010  Job:  [id number]  Electronically Signed by Lonia Blood M.D. on 03/26/2011 02:35:44 PM

## 2011-04-11 ENCOUNTER — Other Ambulatory Visit: Payer: Self-pay | Admitting: Internal Medicine

## 2011-05-08 ENCOUNTER — Other Ambulatory Visit: Payer: Self-pay | Admitting: Internal Medicine

## 2011-05-09 NOTE — Telephone Encounter (Signed)
Gave 2 mos supply - must be seen for future refills - last seen 11/2009

## 2011-06-13 ENCOUNTER — Other Ambulatory Visit: Payer: Self-pay | Admitting: Internal Medicine

## 2011-06-20 ENCOUNTER — Ambulatory Visit: Payer: Self-pay

## 2011-07-12 ENCOUNTER — Other Ambulatory Visit: Payer: Self-pay | Admitting: Internal Medicine

## 2011-07-13 ENCOUNTER — Other Ambulatory Visit: Payer: Self-pay | Admitting: Internal Medicine

## 2011-07-14 NOTE — Telephone Encounter (Signed)
90

## 2011-07-14 NOTE — Telephone Encounter (Signed)
Pt last seen 11/27/09.  Pls advise.

## 2011-07-16 ENCOUNTER — Other Ambulatory Visit: Payer: Self-pay | Admitting: Internal Medicine

## 2011-08-09 ENCOUNTER — Other Ambulatory Visit: Payer: Self-pay | Admitting: Internal Medicine

## 2011-08-11 ENCOUNTER — Ambulatory Visit: Payer: BC Managed Care – PPO

## 2011-08-11 ENCOUNTER — Ambulatory Visit (INDEPENDENT_AMBULATORY_CARE_PROVIDER_SITE_OTHER): Payer: BC Managed Care – PPO | Admitting: Emergency Medicine

## 2011-08-11 VITALS — BP 124/78 | HR 73 | Temp 97.9°F | Resp 16 | Ht 67.0 in | Wt 261.2 lb

## 2011-08-11 DIAGNOSIS — S20229A Contusion of unspecified back wall of thorax, initial encounter: Secondary | ICD-10-CM

## 2011-08-11 DIAGNOSIS — M549 Dorsalgia, unspecified: Secondary | ICD-10-CM

## 2011-08-11 MED ORDER — HYDROCODONE-ACETAMINOPHEN 5-325 MG PO TABS: 1.0000 | ORAL_TABLET | Freq: Four times a day (QID) | ORAL | Status: AC | PRN

## 2011-08-11 MED ORDER — CYCLOBENZAPRINE HCL 10 MG PO TABS: 10.0000 mg | ORAL_TABLET | Freq: Three times a day (TID) | ORAL | Status: AC | PRN

## 2011-08-11 NOTE — Patient Instructions (Signed)
Back Pain, Adult Low back pain is very common. About 1 in 5 people have back pain.The cause of low back pain is rarely dangerous. The pain often gets better over time.About half of people with a sudden onset of back pain feel better in just 2 weeks. About 8 in 10 people feel better by 6 weeks.  CAUSES Some common causes of back pain include:  Strain of the muscles or ligaments supporting the spine.   Wear and tear (degeneration) of the spinal discs.   Arthritis.   Direct injury to the back.  DIAGNOSIS Most of the time, the direct cause of low back pain is not known.However, back pain can be treated effectively even when the exact cause of the pain is unknown.Answering your caregiver's questions about your overall health and symptoms is one of the most accurate ways to make sure the cause of your pain is not dangerous. If your caregiver needs more information, he or she may order lab work or imaging tests (X-rays or MRIs).However, even if imaging tests show changes in your back, this usually does not require surgery. HOME CARE INSTRUCTIONS For many people, back pain returns.Since low back pain is rarely dangerous, it is often a condition that people can learn to manageon their own.   Remain active. It is stressful on the back to sit or stand in one place. Do not sit, drive, or stand in one place for more than 30 minutes at a time. Take short walks on level surfaces as soon as pain allows.Try to increase the length of time you walk each day.   Do not stay in bed.Resting more than 1 or 2 days can delay your recovery.   Do not avoid exercise or work.Your body is made to move.It is not dangerous to be active, even though your back may hurt.Your back will likely heal faster if you return to being active before your pain is gone.   Pay attention to your body when you bend and lift. Many people have less discomfortwhen lifting if they bend their knees, keep the load close to their  bodies,and avoid twisting. Often, the most comfortable positions are those that put less stress on your recovering back.   Find a comfortable position to sleep. Use a firm mattress and lie on your side with your knees slightly bent. If you lie on your back, put a pillow under your knees.   Only take over-the-counter or prescription medicines as directed by your caregiver. Over-the-counter medicines to reduce pain and inflammation are often the most helpful.Your caregiver may prescribe muscle relaxant drugs.These medicines help dull your pain so you can more quickly return to your normal activities and healthy exercise.   Put ice on the injured area.   Put ice in a plastic bag.   Place a towel between your skin and the bag.   Leave the ice on for 15 to 20 minutes, 3 to 4 times a day for the first 2 to 3 days. After that, ice and heat may be alternated to reduce pain and spasms.   Ask your caregiver about trying back exercises and gentle massage. This may be of some benefit.   Avoid feeling anxious or stressed.Stress increases muscle tension and can worsen back pain.It is important to recognize when you are anxious or stressed and learn ways to manage it.Exercise is a great option.  SEEK MEDICAL CARE IF:  You have pain that is not relieved with rest or medicine.   You have   pain that does not improve in 1 week.   You have new symptoms.   You are generally not feeling well.  SEEK IMMEDIATE MEDICAL CARE IF:   You have pain that radiates from your back into your legs.   You develop new bowel or bladder control problems.   You have unusual weakness or numbness in your arms or legs.   You develop nausea or vomiting.   You develop abdominal pain.   You feel faint.  Document Released: 05/30/2005 Document Revised: 02/09/2011 Document Reviewed: 10/18/2010 [employer] Patient Information 2012 [employer]. 

## 2011-08-11 NOTE — Progress Notes (Signed)
  Subjective:    Patient ID: Brandon James., male    DOB: 1971/04/08, 41 y.o.   MRN: [id number]  HPI patient was walking behind a ramp and slipped while he was carrying a refrigerator and fell. He struck his right iliac area. Since that time he has had pain in his back. He denies radicular symptoms down the right leg. He has a history of minor back problems but no serious problems.    Review of Systems specifically denies neurological symptoms. He has had no urinary or bowel changes.     Objective:   Physical Exam there is a large 3 x 8" area of bruising over the right iliac crest. There is tenderness over the lower lumbar spine. Deep tendon reflexes are 1+ and symmetrical. Motor strength is 5 out of 5 all muscle groups.   [facility] reading (PRIMARY) by  Dr.Samhitha Rosen x-ray of LS-spine shows no acute fracture..      Assessment & Plan:  Assessment is contusion to the low lumbar spine. Not experiencing radicular symptoms at this time. Check LS-spine films.

## 2012-01-03 ENCOUNTER — Other Ambulatory Visit: Payer: Self-pay | Admitting: Internal Medicine

## 2012-01-03 NOTE — Telephone Encounter (Signed)
Refused all med Rfs requested - pt has had Rfs with instructions to be seen - last seen 2011

## 2012-01-09 DIAGNOSIS — M109 Gout, unspecified: Secondary | ICD-10-CM | POA: Insufficient documentation

## 2012-01-26 DIAGNOSIS — E559 Vitamin D deficiency, unspecified: Secondary | ICD-10-CM | POA: Insufficient documentation

## 2012-07-19 DIAGNOSIS — N529 Male erectile dysfunction, unspecified: Secondary | ICD-10-CM | POA: Insufficient documentation

## 2013-06-03 DIAGNOSIS — E785 Hyperlipidemia, unspecified: Secondary | ICD-10-CM | POA: Insufficient documentation

## 2013-09-09 DIAGNOSIS — E669 Obesity, unspecified: Secondary | ICD-10-CM | POA: Insufficient documentation

## 2014-02-03 ENCOUNTER — Ambulatory Visit: Payer: Self-pay | Admitting: Psychology

## 2014-03-03 ENCOUNTER — Ambulatory Visit: Payer: Managed Care, Other (non HMO) | Admitting: Psychology

## 2014-04-26 ENCOUNTER — Emergency Department (HOSPITAL_BASED_OUTPATIENT_CLINIC_OR_DEPARTMENT_OTHER)
Admission: EM | Admit: 2014-04-26 | Discharge: 2014-04-26 | Disposition: A | Discharge status: Home / Self Care | Payer: Managed Care, Other (non HMO) | Attending: Emergency Medicine | Admitting: Emergency Medicine

## 2014-04-26 ENCOUNTER — Encounter (HOSPITAL_BASED_OUTPATIENT_CLINIC_OR_DEPARTMENT_OTHER): Payer: Self-pay | Admitting: *Deleted

## 2014-04-26 DIAGNOSIS — E780 Pure hypercholesterolemia: Secondary | ICD-10-CM | POA: Diagnosis not present

## 2014-04-26 DIAGNOSIS — Y9389 Activity, other specified: Secondary | ICD-10-CM | POA: Insufficient documentation

## 2014-04-26 DIAGNOSIS — Z79899 Other long term (current) drug therapy: Secondary | ICD-10-CM | POA: Insufficient documentation

## 2014-04-26 DIAGNOSIS — E119 Type 2 diabetes mellitus without complications: Secondary | ICD-10-CM | POA: Insufficient documentation

## 2014-04-26 DIAGNOSIS — Y9289 Other specified places as the place of occurrence of the external cause: Secondary | ICD-10-CM | POA: Diagnosis not present

## 2014-04-26 DIAGNOSIS — S61210A Laceration without foreign body of right index finger without damage to nail, initial encounter: Secondary | ICD-10-CM

## 2014-04-26 DIAGNOSIS — Z720 Tobacco use: Secondary | ICD-10-CM | POA: Insufficient documentation

## 2014-04-26 DIAGNOSIS — Y998 Other external cause status: Secondary | ICD-10-CM | POA: Diagnosis not present

## 2014-04-26 DIAGNOSIS — W230XXA Caught, crushed, jammed, or pinched between moving objects, initial encounter: Secondary | ICD-10-CM | POA: Diagnosis not present

## 2014-04-26 DIAGNOSIS — I10 Essential (primary) hypertension: Secondary | ICD-10-CM | POA: Insufficient documentation

## 2014-04-26 HISTORY — DX: Type 2 diabetes mellitus without complications: E11.9

## 2014-04-26 HISTORY — DX: Pure hypercholesterolemia, unspecified: E78.00

## 2014-04-26 HISTORY — DX: Essential (primary) hypertension: I10

## 2014-04-26 NOTE — ED Notes (Signed)
Patient was moving furniture and got his hand caught on a metal rod, lac to right index finger. Bleeding controlled.

## 2014-04-26 NOTE — Discharge Instructions (Signed)
Keep wound area clean. Refer to attached documents for more information.  °

## 2014-04-26 NOTE — ED Provider Notes (Signed)
CSN: [id number]     Arrival date & time 04/26/14  1738 History   First MD Initiated Contact with Patient 04/26/14 2106     Chief Complaint  Patient presents with  . Extremity Laceration     (Consider location/radiation/quality/duration/timing/severity/associated sxs/prior Treatment) HPI Comments: Patient is a 43 year old male who presents with a right index finger laceration that occurred prior to arrival when he was moving a recliner. He reports carrying the chair with a friend when his finger got caught between the chair and metal siding. He reports sudden onset of laceration of his finger. The pain is mild and sharp. No other injury. He reports Tdap this year.    Past Medical History  Diagnosis Date  . Diabetes mellitus without complication   . Hypertension   . High cholesterol    History reviewed. No pertinent past surgical history. No family history on file. History  Substance Use Topics  . Smoking status: Current Some Day Smoker -- 10.00 packs/day    Types: Cigarettes  . Smokeless tobacco: Not on file  . Alcohol Use: No    Review of Systems  Constitutional: Negative for fever, chills and fatigue.  HENT: Negative for trouble swallowing.   Eyes: Negative for visual disturbance.  Respiratory: Negative for shortness of breath.   Cardiovascular: Negative for chest pain and palpitations.  Gastrointestinal: Negative for nausea, vomiting, abdominal pain and diarrhea.  Genitourinary: Negative for dysuria and difficulty urinating.  Musculoskeletal: Negative for arthralgias and neck pain.  Skin: Positive for wound. Negative for color change.  Neurological: Negative for dizziness and weakness.  Psychiatric/Behavioral: Negative for dysphoric mood.      Allergies  Review of patient's allergies indicates no known allergies.  Home Medications   Prior to Admission medications   Medication Sig Start Date End Date Taking? Authorizing Provider  atorvastatin (LIPITOR) 10 MG  tablet Take 10 mg by mouth daily.   Yes Historical Provider, MD  allopurinol (ZYLOPRIM) 100 MG tablet TAKE ONE TABLET BY MOUTH EVERY DAY 07/12/11   Gordy SaversPeter F Kwiatkowski, MD  glipiZIDE (GLUCOTROL XL) 10 MG 24 hr tablet TAKE ONE TABLET BY MOUTH EVERY DAY 07/13/11   Gordy SaversPeter F Kwiatkowski, MD  lisinopril-hydrochlorothiazide (PRINZIDE,ZESTORETIC) 20-25 MG per tablet TAKE ONE TABLET BY MOUTH EVERY DAY 06/13/11   Gordy SaversPeter F Kwiatkowski, MD  lisinopril-hydrochlorothiazide (PRINZIDE,ZESTORETIC) 20-25 MG per tablet TAKE ONE TABLET BY MOUTH EVERY DAY 07/16/11   Gordy SaversPeter F Kwiatkowski, MD  metFORMIN (GLUCOPHAGE-XR) 500 MG 24 hr tablet TAKE TWO TABLETS BY MOUTH TWICE DAILY 08/09/11   Gordy SaversPeter F Kwiatkowski, MD   BP 115/81 mmHg  Pulse 85  Temp(Src) 98.7 F (37.1 C) (Oral)  Resp 16  Ht 5\' 8"  (1.727 m)  Wt 237 lb (107.502 kg)  BMI 36.04 kg/m2  SpO2 97% Physical Exam  Constitutional: He is oriented to person, place, and time. He appears well-developed and well-nourished. No distress.  HENT:  Head: Normocephalic and atraumatic.  Eyes: Conjunctivae and EOM are normal.  Neck: Normal range of motion.  Cardiovascular: Normal rate and regular rhythm.  Exam reveals no gallop and no friction rub.   No murmur heard. Pulmonary/Chest: Effort normal and breath sounds normal. He has no wheezes. He has no rales. He exhibits no tenderness.  Abdominal: Soft. There is no tenderness.  Musculoskeletal: Normal range of motion.  Full ROM of right index finger. No obvious deformity.   Neurological: He is alert and oriented to person, place, and time. Coordination normal.  Speech is goal-oriented. Moves limbs  without ataxia.   Skin: Skin is warm and dry.  1.5 cm superficial laceration of right dorsal index finger.   Psychiatric: He has a normal mood and affect. His behavior is normal.  Nursing note and vitals reviewed.   ED Course  Procedures (including critical care time) Labs Review Labs Reviewed - No data to display  LACERATION  REPAIR Performed by: Emilia BeckKaitlyn Shadrick Senne Authorized by: Emilia BeckKaitlyn Afia Messenger Consent: Verbal consent obtained. Risks and benefits: risks, benefits and alternatives were discussed Consent given by: patient Patient identity confirmed: provided demographic data Prepped and Draped in normal sterile fashion Wound explored  Laceration Location: right index finger  Laceration Length: 1.5 cm  No Foreign Bodies seen or palpated  Anesthesia: none  Irrigation method: syringe Amount of cleaning: standard  Skin closure: dermabond  Number of sutures: n/a  Technique: n/a  Patient tolerance: Patient tolerated the procedure well with no immediate complications.   Imaging Review No results found.   EKG Interpretation None      MDM   Final diagnoses:  Laceration of right index finger w/o foreign body w/o damage to nail, initial encounter    9:42 PM Patient will have dermabond for wound repair. No neurovascular compromise. No other injury.    Emilia BeckKaitlyn Sammy Cassar, PA-C 04/26/14 2212  Rolan BuccoMelanie Belfi, MD 04/26/14 2300

## 2018-03-18 ENCOUNTER — Emergency Department (HOSPITAL_BASED_OUTPATIENT_CLINIC_OR_DEPARTMENT_OTHER)
Admission: EM | Admit: 2018-03-18 | Discharge: 2018-03-18 | Disposition: A | Discharge status: Home / Self Care | Payer: Managed Care, Other (non HMO) | Attending: Emergency Medicine | Admitting: Emergency Medicine

## 2018-03-18 ENCOUNTER — Encounter (HOSPITAL_BASED_OUTPATIENT_CLINIC_OR_DEPARTMENT_OTHER): Payer: Self-pay | Admitting: Emergency Medicine

## 2018-03-18 ENCOUNTER — Other Ambulatory Visit: Payer: Self-pay

## 2018-03-18 DIAGNOSIS — Z79899 Other long term (current) drug therapy: Secondary | ICD-10-CM | POA: Diagnosis not present

## 2018-03-18 DIAGNOSIS — I10 Essential (primary) hypertension: Secondary | ICD-10-CM | POA: Diagnosis not present

## 2018-03-18 DIAGNOSIS — M25572 Pain in left ankle and joints of left foot: Secondary | ICD-10-CM | POA: Insufficient documentation

## 2018-03-18 DIAGNOSIS — Z7984 Long term (current) use of oral hypoglycemic drugs: Secondary | ICD-10-CM | POA: Insufficient documentation

## 2018-03-18 DIAGNOSIS — E119 Type 2 diabetes mellitus without complications: Secondary | ICD-10-CM | POA: Diagnosis not present

## 2018-03-18 DIAGNOSIS — F17290 Nicotine dependence, other tobacco product, uncomplicated: Secondary | ICD-10-CM | POA: Insufficient documentation

## 2018-03-18 MED ORDER — ACETAMINOPHEN 500 MG PO TABS: 500.0000 mg | ORAL_TABLET | Freq: Four times a day (QID) | ORAL | 0 refills | Status: DC | PRN

## 2018-03-18 MED ORDER — ACETAMINOPHEN 500 MG PO TABS
1000.0000 mg | ORAL_TABLET | Freq: Once | ORAL | Status: AC
  Administered 2018-03-18: 1000 mg via ORAL
  Filled 2018-03-18: qty 2

## 2018-03-18 MED ORDER — IBUPROFEN 600 MG PO TABS: 600.0000 mg | ORAL_TABLET | Freq: Four times a day (QID) | ORAL | 0 refills | Status: DC | PRN

## 2018-03-18 NOTE — ED Notes (Signed)
ED Provider at bedside. 

## 2018-03-18 NOTE — ED Provider Notes (Signed)
[facility] EMERGENCY DEPARTMENT Provider Note   CSN: [id number] Arrival date & time: 03/18/18  1903     History   Chief Complaint Chief Complaint  Patient presents with  . Ankle Pain  . Foot Pain    HPI Brandon Greogry Goodwyn. is a 47 y.o. male with history of hypertension, hypercholesterolemia, diabetes who presents with left ankle pain and heel pain.  Patient reports he went to turn when he was in his kitchen today, and felt a severe pain.  He could no longer stand.  He denies any trauma or other injury.  He has been using crutches since.  No medications taken prior to arrival.  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  He has no history of blood clots.  HPI  Past Medical History:  Diagnosis Date  . Diabetes mellitus without complication (HCC)   . High cholesterol   . Hypertension     Patient Active Problem List   Diagnosis Date Noted  . VASOVAGAL SYNCOPE 04/16/2008  . TOBACCO ABUSE 03/20/2008  . GOUTY ARTHROPATHY 06/29/2007  . ANXIETY STATE, UNSPECIFIED 06/29/2007  . URI 06/29/2007  . DIABETES MELLITUS, TYPE II 03/16/2007  . HYPERTENSION 03/16/2007  . LOW BACK PAIN 03/16/2007  . HEADACHE 03/16/2007    Past Surgical History:  Procedure Laterality Date  . TONSILLECTOMY          Home Medications    Prior to Admission medications   Medication Sig Start Date End Date Taking? Authorizing Provider  acetaminophen (TYLENOL) 500 MG tablet Take 1 tablet (500 mg total) by mouth every 6 (six) hours as needed. 03/18/18   Syrenity Klepacki, Waylan Boga, PA-C  allopurinol (ZYLOPRIM) 100 MG tablet TAKE ONE TABLET BY MOUTH EVERY DAY 07/12/11   Gordy Savers, MD  atorvastatin (LIPITOR) 10 MG tablet Take 10 mg by mouth daily.    [provider]  glipiZIDE (GLUCOTROL XL) 10 MG 24 hr tablet TAKE ONE TABLET BY MOUTH EVERY DAY 07/13/11   Gordy Savers, MD  ibuprofen (ADVIL,MOTRIN) 600 MG tablet Take 1 tablet (600 mg total) by mouth every 6 (six)  hours as needed. 03/18/18   Anarie Kalish, Waylan Boga, PA-C  lisinopril-hydrochlorothiazide (PRINZIDE,ZESTORETIC) 20-25 MG per tablet TAKE ONE TABLET BY MOUTH EVERY DAY 06/13/11   Gordy Savers, MD  lisinopril-hydrochlorothiazide (PRINZIDE,ZESTORETIC) 20-25 MG per tablet TAKE ONE TABLET BY MOUTH EVERY DAY 07/16/11   Gordy Savers, MD  metFORMIN (GLUCOPHAGE-XR) 500 MG 24 hr tablet TAKE TWO TABLETS BY MOUTH TWICE DAILY 08/09/11   Gordy Savers, MD    Family History No family history on file.  Social History Social History   Tobacco Use  . Smoking status: Current Some Day Smoker    Packs/day: 10.00    Types: Cigars  . Smokeless tobacco: Never Used  Substance Use Topics  . Alcohol use: No  . Drug use: No     Allergies   Patient has no known allergies.   Review of Systems Review of Systems  Constitutional: Negative for fever.  Respiratory: Negative for shortness of breath.   Cardiovascular: Negative for chest pain.  Musculoskeletal: Positive for arthralgias and joint swelling.  Neurological: Negative for numbness.     Physical Exam Updated Vital Signs BP (!) 143/88 (BP Location: Right Arm)   Pulse (!) 56   Temp 98.3 F (36.8 C) (Oral)   Resp 16   Ht 5\' 7"  (1.702 m)   Wt 98.4 kg   SpO2 99%  BMI 33.99 kg/m   Physical Exam  Constitutional: He appears well-developed and well-nourished. No distress.  HENT:  Head: Normocephalic and atraumatic.  Mouth/Throat: Oropharynx is clear and moist. No oropharyngeal exudate.  Eyes: Pupils are equal, round, and reactive to light. Conjunctivae are normal. Right eye exhibits no discharge. Left eye exhibits no discharge. No scleral icterus.  Neck: Normal range of motion. Neck supple. No thyromegaly present.  Cardiovascular: Normal rate, regular rhythm, normal heart sounds and intact distal pulses. Exam reveals no gallop and no friction rub.  No murmur heard. Pulmonary/Chest: Effort normal and breath sounds normal. No  stridor. No respiratory distress. He has no wheezes. He has no rales.  Abdominal: Soft. Bowel sounds are normal. He exhibits no distension. There is no tenderness. There is no rebound and no guarding.  Musculoskeletal: He exhibits no edema.       Left ankle: He exhibits swelling. No lateral malleolus, no medial malleolus, no head of 5th metatarsal and no proximal fibula tenderness found. Achilles tendon exhibits pain. Achilles tendon exhibits no defect (There is tenderness over an area of the achilles that is not as smooth as the rest, however no break in tissue palpated) and normal Thompson's test results.       Feet:  No calf tenderness  Lymphadenopathy:    He has no cervical adenopathy.  Neurological: He is alert. Coordination normal.  Skin: Skin is warm and dry. No rash noted. He is not diaphoretic. No pallor.  Psychiatric: He has a normal mood and affect.  Nursing note and vitals reviewed.    ED Treatments / Results  Labs (all labs ordered are listed, but only abnormal results are displayed) Labs Reviewed - No data to display  EKG None  Radiology No results found.  Procedures Procedures (including critical care time)  Medications Ordered in ED Medications  acetaminophen (TYLENOL) tablet 1,000 mg (1,000 mg Oral Given 03/18/18 2131)     Initial Impression / Assessment and Plan / ED Course  I have reviewed the triage vital signs and the nursing notes.  Pertinent labs & imaging results that were available during my care of the patient were reviewed by me and considered in my medical decision making (see chart for details).     Patient with suspected Achilles injury considering sudden onset and inability to bear weight.  However there is significant swelling to the foot and ankle.  Patient has no calf tenderness.  Low suspicion for DVT, however considering patient's profession and driving in the car for hours at a time in association with the edema noted on exam, I  recommended he return for Doppler ultrasound tomorrow.  We will treat supportively with cam walker boot, ibuprofen, Tylenol, ice, crutches.  Follow-up to orthopedics.  Return precautions discussed.  Patient understands and agrees with plan.  Patient vitals stable throughout ED course and discharged in satisfactory condition.  Final Clinical Impressions(s) / ED Diagnoses   Final diagnoses:  Acute left ankle pain    ED Discharge Orders         Ordered    US Venous Img Lower Unilateral Left     03/18/18 2143    ibuprofen (ADVIL,MOTRIN) 600 MG tablet  Every 6 hours PRN     03/18/18 2143    acetaminophen (TYLENOL) 500 MG tablet  Every 6 hours PRN     03/18/18 2143           Emi Holes, PA-C 03/19/18 0003    Cathren Laine, MD 03/19/18  1638  

## 2018-03-18 NOTE — ED Triage Notes (Signed)
Pt c/o left heel and ankle pain onset today, pt denies injury.

## 2018-03-18 NOTE — Discharge Instructions (Signed)
Alternate ibuprofen and Tylenol as prescribed, as needed for your pain.  Use ice 3-4 times daily alternating 20 minutes on, 20 minutes off to help with pain and swelling.  Elevate your leg whenever you are not walking on it.  Use cam walker boot to ambulate at all times.  You can use crutches as needed.  Please follow-up with orthopedic doctor below for further management.  Please return the emergency department if you develop any new or worsening symptoms.  Please return for your ultrasound tomorrow as scheduled.

## 2018-03-19 ENCOUNTER — Ambulatory Visit (HOSPITAL_BASED_OUTPATIENT_CLINIC_OR_DEPARTMENT_OTHER)
Admit: 2018-03-19 | Discharge: 2018-03-19 | Disposition: A | Discharge status: Home / Self Care | Payer: Managed Care, Other (non HMO) | Attending: Emergency Medicine | Admitting: Emergency Medicine

## 2018-03-19 DIAGNOSIS — M7989 Other specified soft tissue disorders: Secondary | ICD-10-CM | POA: Diagnosis not present

## 2018-03-19 DIAGNOSIS — M25572 Pain in left ankle and joints of left foot: Secondary | ICD-10-CM | POA: Insufficient documentation

## 2019-03-27 ENCOUNTER — Encounter: Payer: Self-pay | Admitting: Gastroenterology

## 2019-04-12 ENCOUNTER — Ambulatory Visit: Payer: Managed Care, Other (non HMO) | Admitting: *Deleted

## 2019-04-12 ENCOUNTER — Encounter: Payer: Self-pay | Admitting: Gastroenterology

## 2019-04-12 ENCOUNTER — Other Ambulatory Visit: Payer: Self-pay

## 2019-04-12 VITALS — Temp 97.5°F | Ht 67.0 in | Wt 230.0 lb

## 2019-04-12 DIAGNOSIS — Z1211 Encounter for screening for malignant neoplasm of colon: Secondary | ICD-10-CM

## 2019-04-12 DIAGNOSIS — Z1159 Encounter for screening for other viral diseases: Secondary | ICD-10-CM

## 2019-04-12 MED ORDER — NA SULFATE-K SULFATE-MG SULF 17.5-3.13-1.6 GM/177ML PO SOLN: ORAL | 0 refills | Status: DC

## 2019-04-12 NOTE — Progress Notes (Signed)
Patient is here in-person for PV. Patient denies any allergies to eggs or soy. Patient denies any problems with anesthesia/sedation. Patient denies any oxygen use at home. Patient denies taking any diet/weight loss medications or blood thinners. Patient is not being treated for MRSA or C-diff. EMMI education assisgned to the patient for the procedure, this was explained and instructions given to patient. COVID-19 screening test is on 11/6 1020 am, the pt is aware. Pt is aware that care partner will wait in the car during procedure; if they feel like they will be too hot or cold to wait in the car; they may wait in the 4 th floor lobby. Patient is aware to bring only one care partner. We want them to wear a mask (we do not have any that we can provide them), practice social distancing, and we will check their temperatures when they get here.  I did remind the patient that their care partner needs to stay in the parking lot the entire time and have a cell phone available, we will call them when the pt is ready for discharge. Patient will wear mask into building.    Suprep $15 off coupon given to the patient.

## 2019-04-19 ENCOUNTER — Other Ambulatory Visit: Payer: Self-pay | Admitting: Gastroenterology

## 2019-04-19 LAB — SARS CORONAVIRUS 2 (TAT 6-24 HRS): SARS Coronavirus 2: NEGATIVE

## 2019-04-24 ENCOUNTER — Other Ambulatory Visit: Payer: Self-pay

## 2019-04-24 ENCOUNTER — Ambulatory Visit (AMBULATORY_SURGERY_CENTER): Payer: Managed Care, Other (non HMO) | Admitting: Gastroenterology

## 2019-04-24 ENCOUNTER — Encounter: Payer: Managed Care, Other (non HMO) | Admitting: Gastroenterology

## 2019-04-24 ENCOUNTER — Encounter: Payer: Self-pay | Admitting: Gastroenterology

## 2019-04-24 VITALS — BP 140/81 | HR 56 | Temp 98.1°F | Resp 20 | Ht 67.0 in | Wt 230.0 lb

## 2019-04-24 DIAGNOSIS — Z1211 Encounter for screening for malignant neoplasm of colon: Secondary | ICD-10-CM | POA: Diagnosis present

## 2019-04-24 MED ORDER — SODIUM CHLORIDE 0.9 % IV SOLN: 500.0000 mL | Freq: Once | INTRAVENOUS | Status: DC

## 2019-04-24 NOTE — Patient Instructions (Addendum)
YOU HAD AN ENDOSCOPIC PROCEDURE TODAY AT [facility]:   Refer to the procedure report that was given to you for any specific questions about what was found during the examination.  If the procedure report does not answer your questions, please call your gastroenterologist to clarify.  If you requested that your care partner not be given the details of your procedure findings, then the procedure report has been included in a sealed envelope for you to review at your convenience later.  YOU SHOULD EXPECT: Some feelings of bloating in the abdomen. Passage of more gas than usual.  Walking can help get rid of the air that was put into your GI tract during the procedure and reduce the bloating. If you had a lower endoscopy (such as a colonoscopy or flexible sigmoidoscopy) you may notice spotting of blood in your stool or on the toilet paper. If you underwent a bowel prep for your procedure, you may not have a normal bowel movement for a few days.  Please Note:  You might notice some irritation and congestion in your nose or some drainage.  This is from the oxygen used during your procedure.  There is no need for concern and it should clear up in a day or so.  SYMPTOMS TO REPORT IMMEDIATELY:   Following lower endoscopy (colonoscopy or flexible sigmoidoscopy):  Excessive amounts of blood in the stool  Significant tenderness or worsening of abdominal pains  Swelling of the abdomen that is new, acute  Fever of 100F or higher  For urgent or emergent issues, a gastroenterologist can be reached at any hour by calling [phone].   DIET:  We do recommend a small meal at first, but then you may proceed to your regular diet.  Drink plenty of fluids but you should avoid alcoholic beverages for 24 hours.  ACTIVITY:  You should plan to take it easy for the rest of today and you should NOT DRIVE or use heavy machinery until tomorrow (because of the sedation medicines used during the test).     FOLLOW UP: Our staff will call the number listed on your records 48-72 hours following your procedure to check on you and address any questions or concerns that you may have regarding the information given to you following your procedure. If we do not reach you, we will leave a message.  We will attempt to reach you two times.  During this call, we will ask if you have developed any symptoms of COVID 19. If you develop any symptoms (ie: fever, flu-like symptoms, shortness of breath, cough etc.) before then, please call [phone].  If you test positive for Covid 19 in the 2 weeks post procedure, please call and report this information to [place].    If any biopsies were taken you will be contacted by phone or by letter within the next 1-3 weeks.  Please call us at [phone] if you have not heard about the biopsies in 3 weeks.    SIGNATURES/CONFIDENTIALITY: You and/or your care partner have signed paperwork which will be entered into your electronic medical record.  These signatures attest to the fact that that the information above on your After Visit Summary has been reviewed and is understood.  Full responsibility of the confidentiality of this discharge information lies with you and/or your care-partner.     Repeat colonoscopy in 10 years for screening purposes, earlier with any new symptoms. Your blood sugar was 126 in the recovery room. You may  resume your current medications today.  Please call if any questions or concerns.

## 2019-04-24 NOTE — Op Note (Signed)
[facility] Patient Name: Brandon James Procedure Date: 04/24/2019 9:29 AM MRN: [id number] Endoscopist: Tressia Danas MD, MD Age: 48 Referring MD:  Date of Birth: February 13, 1971 Gender: Male Account #: [id number] Procedure:                Colonoscopy Indications:              Screening for colorectal malignant neoplasm, This                            is the patient's first colonoscopy                           No known family history of colon cancer or polyps. Medicines:                Monitored Anesthesia Care Procedure:                Pre-Anesthesia Assessment:                           - Prior to the procedure, a History and Physical                            was performed, and patient medications and                            allergies were reviewed. The patient's tolerance of                            previous anesthesia was also reviewed. The risks                            and benefits of the procedure and the sedation                            options and risks were discussed with the patient.                            All questions were answered, and informed consent                            was obtained. Prior Anticoagulants: The patient has                            taken no previous anticoagulant or antiplatelet                            agents. ASA Grade Assessment: II - A patient with                            mild systemic disease. After reviewing the risks                            and benefits, the patient was deemed in  satisfactory condition to undergo the procedure.                           After obtaining informed consent, the colonoscope                            was passed under direct vision. Throughout the                            procedure, the patient's blood pressure, pulse, and                            oxygen saturations were monitored continuously. The                            Colonoscope was  introduced through the anus and                            advanced to the the cecum, identified by                            appendiceal orifice and ileocecal valve. A second                            forward view of the right colon was performed. The                            colonoscopy was performed without difficulty. The                            patient tolerated the procedure well. The quality                            of the bowel preparation was good. The ileocecal                            valve, appendiceal orifice, and rectum were                            photographed. Scope In: 9:42:22 AM Scope Out: 9:56:37 AM Scope Withdrawal Time: 0 hours 10 minutes 38 seconds  Total Procedure Duration: 0 hours 14 minutes 15 seconds  Findings:                 The perianal and digital rectal examinations were                            normal.                           The entire examined colon appeared normal on direct                            and retroflexion views. Complications:            No immediate complications.  Estimated Blood Loss:     Estimated blood loss: none. Impression:               - The entire examined colon is normal on direct and                            retroflexion views.                           - No specimens collected. Recommendation:           - Patient has a contact number available for                            emergencies. The signs and symptoms of potential                            delayed complications were discussed with the                            patient. Return to normal activities tomorrow.                            Written discharge instructions were provided to the                            patient.                           - Resume previous diet today.                           - Continue present medications.                           - Repeat colonoscopy in 10 years for screening                            purposes, earlier  with any new symptoms. Thornton Park MD, MD 04/24/2019 10:03:13 AM This report has been signed electronically.

## 2019-04-24 NOTE — Progress Notes (Signed)
Pt's states no medical or surgical changes since previsit or office visit.  TEMP-JB  V/S-CW 

## 2019-04-24 NOTE — Progress Notes (Signed)
Pt works 22:00 - 0500.  Pt was instructed not to go to work tonight.  He was given a work note.  No other problems noted in the recovery room. maw

## 2019-04-24 NOTE — Progress Notes (Signed)
Report to PACU, RN, vss, BBS= Clear.  

## 2019-04-24 NOTE — Progress Notes (Signed)
No problems noted in the recovery room. maw 

## 2019-04-25 ENCOUNTER — Encounter: Payer: Managed Care, Other (non HMO) | Admitting: Gastroenterology

## 2019-04-26 ENCOUNTER — Telehealth: Payer: Self-pay

## 2019-04-26 NOTE — Telephone Encounter (Signed)
  Follow up Call-  Call back number 04/24/2019  Post procedure Call Back phone  # [phone]  Permission to leave phone message Yes  Some recent data might be hidden     Patient questions:  Do you have a fever, pain , or abdominal swelling? No. Pain Score  0 *  Have you tolerated food without any problems? Yes.    Have you been able to return to your normal activities? Yes.    Do you have any questions about your discharge instructions: Diet   No. Medications  No. Follow up visit  No.  Do you have questions or concerns about your Care? No.  Actions: * If pain score is 4 or above: No action needed, pain <4.  1. Have you developed a fever since your procedure? No  2.   Have you had an respiratory symptoms (SOB or cough) since your procedure? No  3.   Have you tested positive for COVID 19 since your procedure No  4.   Have you had any family members/close contacts diagnosed with the COVID 19 since your procedure?  No  If yes to any of these questions please route to Joylene John, RN and Alphonsa Gin, RN.

## 2019-09-21 ENCOUNTER — Encounter (HOSPITAL_COMMUNITY): Payer: Self-pay | Admitting: Emergency Medicine

## 2019-09-21 ENCOUNTER — Emergency Department (HOSPITAL_COMMUNITY)
Admission: EM | Admit: 2019-09-21 | Discharge: 2019-09-21 | Disposition: A | Discharge status: Home / Self Care | Payer: Managed Care, Other (non HMO) | Attending: Emergency Medicine | Admitting: Emergency Medicine

## 2019-09-21 ENCOUNTER — Emergency Department (HOSPITAL_COMMUNITY): Payer: Managed Care, Other (non HMO)

## 2019-09-21 ENCOUNTER — Other Ambulatory Visit: Payer: Self-pay

## 2019-09-21 DIAGNOSIS — F17210 Nicotine dependence, cigarettes, uncomplicated: Secondary | ICD-10-CM | POA: Insufficient documentation

## 2019-09-21 DIAGNOSIS — M79632 Pain in left forearm: Secondary | ICD-10-CM | POA: Insufficient documentation

## 2019-09-21 DIAGNOSIS — Z7984 Long term (current) use of oral hypoglycemic drugs: Secondary | ICD-10-CM | POA: Diagnosis not present

## 2019-09-21 DIAGNOSIS — I10 Essential (primary) hypertension: Secondary | ICD-10-CM | POA: Diagnosis not present

## 2019-09-21 DIAGNOSIS — M25512 Pain in left shoulder: Secondary | ICD-10-CM | POA: Diagnosis not present

## 2019-09-21 DIAGNOSIS — R202 Paresthesia of skin: Secondary | ICD-10-CM | POA: Diagnosis not present

## 2019-09-21 DIAGNOSIS — R200 Anesthesia of skin: Secondary | ICD-10-CM | POA: Diagnosis not present

## 2019-09-21 DIAGNOSIS — E119 Type 2 diabetes mellitus without complications: Secondary | ICD-10-CM | POA: Insufficient documentation

## 2019-09-21 LAB — DIFFERENTIAL
Abs Immature Granulocytes: 0.02 10*3/uL (ref 0.00–0.07)
Basophils Absolute: 0.1 10*3/uL (ref 0.0–0.1)
Basophils Relative: 1 %
Eosinophils Absolute: 0.2 10*3/uL (ref 0.0–0.5)
Eosinophils Relative: 4 %
Immature Granulocytes: 0 %
Lymphocytes Relative: 45 %
Lymphs Abs: 2.2 10*3/uL (ref 0.7–4.0)
Monocytes Absolute: 0.3 10*3/uL (ref 0.1–1.0)
Monocytes Relative: 5 %
Neutro Abs: 2.2 10*3/uL (ref 1.7–7.7)
Neutrophils Relative %: 45 %

## 2019-09-21 LAB — COMPREHENSIVE METABOLIC PANEL
ALT: 18 U/L (ref 0–44)
AST: 28 U/L (ref 15–41)
Albumin: 4 g/dL (ref 3.5–5.0)
Alkaline Phosphatase: 44 U/L (ref 38–126)
Anion gap: 10 (ref 5–15)
BUN: 12 mg/dL (ref 6–20)
CO2: 23 mmol/L (ref 22–32)
Calcium: 9 mg/dL (ref 8.9–10.3)
Chloride: 105 mmol/L (ref 98–111)
Creatinine, Ser: 1.3 mg/dL — ABNORMAL HIGH (ref 0.61–1.24)
GFR calc Af Amer: >60 mL/min (ref >60)
GFR calc non Af Amer: >60 mL/min (ref >60)
Glucose, Bld: 244 mg/dL — ABNORMAL HIGH (ref 70–99)
Potassium: 4.1 mmol/L (ref 3.5–5.1)
Sodium: 138 mmol/L (ref 135–145)
Total Bilirubin: 0.7 mg/dL (ref 0.3–1.2)
Total Protein: 6.4 g/dL — ABNORMAL LOW (ref 6.5–8.1)

## 2019-09-21 LAB — CBC
HCT: 41.5 % (ref 39.0–52.0)
Hemoglobin: 14.4 g/dL (ref 13.0–17.0)
MCH: 29.7 pg (ref 26.0–34.0)
MCHC: 34.7 g/dL (ref 30.0–36.0)
MCV: 85.6 fL (ref 80.0–100.0)
Platelets: 222 10*3/uL (ref 150–400)
RBC: 4.85 MIL/uL (ref 4.22–5.81)
RDW: 12.7 % (ref 11.5–15.5)
WBC: 4.9 10*3/uL (ref 4.0–10.5)
nRBC: 0 % (ref 0.0–0.2)

## 2019-09-21 LAB — PROTIME-INR
INR: 1 (ref 0.8–1.2)
Prothrombin Time: 12.8 seconds (ref 11.4–15.2)

## 2019-09-21 LAB — APTT: aPTT: 28 seconds (ref 24–36)

## 2019-09-21 MED ORDER — SODIUM CHLORIDE 0.9% FLUSH: 3.0000 mL | Freq: Once | INTRAVENOUS | Status: DC

## 2019-09-21 NOTE — ED Triage Notes (Signed)
C/o numbness and tingling to L side of head, L neck, L arm, and fingers x 3 weeks.  No weakness noted on triage exam.  Denies pain.

## 2019-09-21 NOTE — ED Provider Notes (Signed)
[facility] Provider Note  CSN: [id number] Arrival date & time: 09/21/19 1829    History Chief Complaint  Patient presents with  . Numbness    HPI  Brandon James. is a 49 y.o. male presents to the ED for evaluation of intermittent tingling in L hand for the last several weeks. Reports the tingling comes and goes, mostly in the 2nd and 3rd digits. 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 Today he was helping moves some stuff around his house when he had an aching pain in his L forearm and L shoulder/trapezius area with tingling radiating into the back of his head and down his arm. He did not have any weakness. No complete loss of sensation or grip strength/coordination. No facial droop or slurred speech and no symptoms in his LE. He is symptom free at the time of my evaluation.    Past Medical History:  Diagnosis Date  . Diabetes mellitus without complication ([place])   . High cholesterol   . Hypertension     Past Surgical History:  Procedure Laterality Date  . TONSILLECTOMY      Family History  Problem Relation Age of Onset  . Colon polyps Mother   . Colon cancer Neg Hx   . Esophageal cancer Neg Hx   . Rectal cancer Neg Hx   . Stomach cancer Neg Hx     Social History   Tobacco Use  . Smoking status: Current Every Day Smoker    Packs/day: 10.00    Types: Cigars, Cigarettes  . Smokeless tobacco: Never Used  Substance Use Topics  . Alcohol use: Yes    Comment: occ wine   . Drug use: No     Home Medications Prior to Admission medications   Medication Sig Start Date End Date Taking? Authorizing Provider  UNABLE TO FIND Med Name: [facility] 2 tabs daily    [provider]     Allergies    Patient has no known allergies.   Review of Systems   Review of Systems  Constitutional: Negative for fever.  HENT: Negative  for congestion and sore throat.   Respiratory: Negative for cough and shortness of breath.   Cardiovascular: Negative for chest pain.  Gastrointestinal: Negative for abdominal pain, diarrhea, nausea and vomiting.  Genitourinary: Negative for dysuria.  Musculoskeletal: Negative for myalgias.  Skin: Negative for rash.  Neurological: Positive for numbness. Negative for weakness and headaches.  Psychiatric/Behavioral: Negative for behavioral problems.     Physical Exam BP 131/77 (BP Location: Right Arm)   Pulse 90   Temp 98.2 F (36.8 C) (Oral)   Resp 14   Ht 5\' 7"  (1.702 m)   Wt 103.4 kg   SpO2 97%   BMI 35.71 kg/m   Physical Exam Constitutional:      Appearance: Normal appearance.  HENT:     Head: Normocephalic and atraumatic.     Nose: Nose normal.     Mouth/Throat:     Mouth: Mucous membranes are moist.  Eyes:     Extraocular Movements: Extraocular movements intact.     Conjunctiva/sclera: Conjunctivae normal.  Cardiovascular:     Rate and Rhythm: Normal rate.  Pulmonary:     Effort: Pulmonary effort is normal.     Breath sounds: Normal breath sounds.  Abdominal:     General: Abdomen  is flat.     Palpations: Abdomen is soft.     Tenderness: There is no abdominal tenderness.  Musculoskeletal:        General: No swelling. Normal range of motion.     Cervical back: Neck supple.     Comments: Neg Phalen's and Tinel's signs  Skin:    General: Skin is warm and dry.  Neurological:     General: No focal deficit present.     Mental Status: He is alert and oriented to person, place, and time.     Cranial Nerves: No cranial nerve deficit.     Sensory: No sensory deficit.     Motor: No weakness.     Coordination: Coordination normal.     Gait: Gait normal.  Psychiatric:        Mood and Affect: Mood normal.      ED Results / Procedures / Treatments   Labs (all labs ordered are listed, but only abnormal results are displayed) Labs Reviewed  COMPREHENSIVE  METABOLIC PANEL - Abnormal; Notable for the following components:      Result Value   Glucose, Bld 244 (*)    Creatinine, Ser 1.30 (*)    Total Protein 6.4 (*)    All other components within normal limits  PROTIME-INR  APTT  CBC  DIFFERENTIAL    EKG None  Radiology CT HEAD WO CONTRAST  Result Date: 09/21/2019 CLINICAL DATA:  Left-sided numbness EXAM: CT HEAD WITHOUT CONTRAST TECHNIQUE: Contiguous axial images were obtained from the base of the skull through the vertex without intravenous contrast. COMPARISON:  12/15/2010 FINDINGS: Brain: No evidence of acute infarction, hemorrhage, hydrocephalus, extra-axial collection or mass lesion/mass effect. Vascular: No hyperdense vessel or unexpected calcification. Skull: Normal. Negative for fracture or focal lesion. Sinuses/Orbits: No acute finding. Other: None. IMPRESSION: No acute intracranial findings. Electronically Signed   By: Duanne Guess D.O.   On: 09/21/2019 19:29    Procedures Procedures  Medications Ordered in the ED Medications  sodium chloride flush (NS) 0.9 % injection 3 mL (has no administration in time range)     ED Course  I have reviewed the triage vital signs and the nursing notes.  Pertinent labs & imaging results that were available during my care of the patient were reviewed by me and considered in my medical decision making (see chart for details).  Clinical Course as of Sep 20 2032  Sat Sep 21, 2019  2032 Patient with paresthesias in LUE off and on for a few weeks, worse today but since resolved. 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 No concern for stroke. Normal Neuro exam now. Plan discharge with outpatient followup. Advised to avoid resting his arm for extended periods.    [CS]    Clinical Course User Index [CS] Pollyann Savoy, MD    MDM Rules/Calculators/A&P MDM  Final Clinical Impression(s) / ED Diagnoses Final diagnoses:  Paresthesia     Rx / DC Orders ED Discharge Orders    None       Pollyann Savoy, MD 09/21/19 2034

## 2020-12-31 IMAGING — CT CT HEAD W/O CM
3 series · 14 of 46 positions shown, 16 images · non-contrast
Comparison: 12/15/2010

CLINICAL DATA: Left-sided numbness

EXAM:
CT HEAD WITHOUT CONTRAST
TECHNIQUE: Contiguous axial images were obtained from the base of the skull
through the vertex without intravenous contrast.

[Series 3: head 5.0 h30s · axial · 0.42mm/px · z∈[-124,-4]mm · 8 of 29 slices shown, 10 images]
[im 3/29  brain]
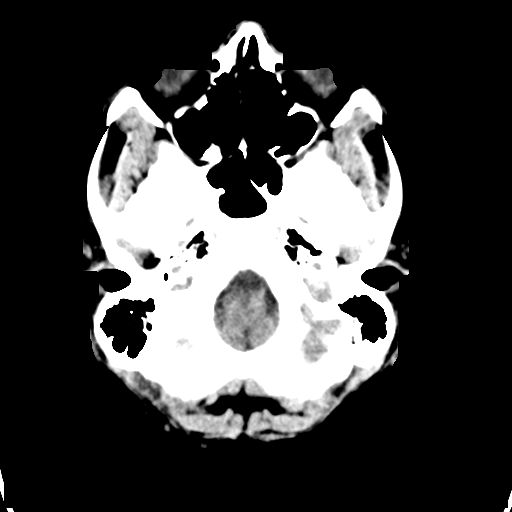
[im 3/29  bone]
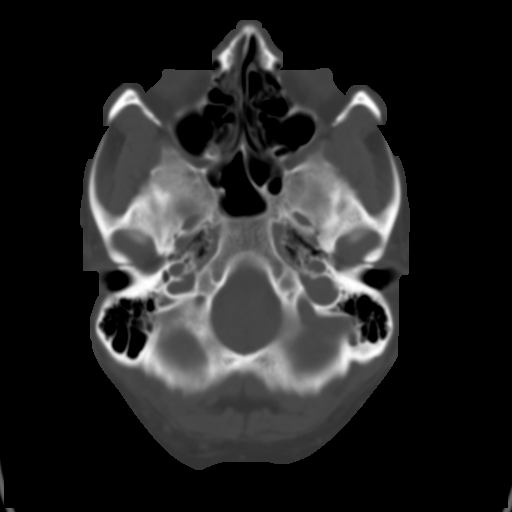
[im 7/29  brain]
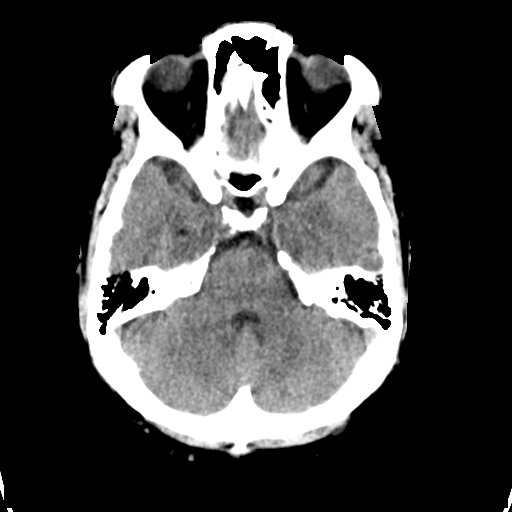
[im 10/29  brain]
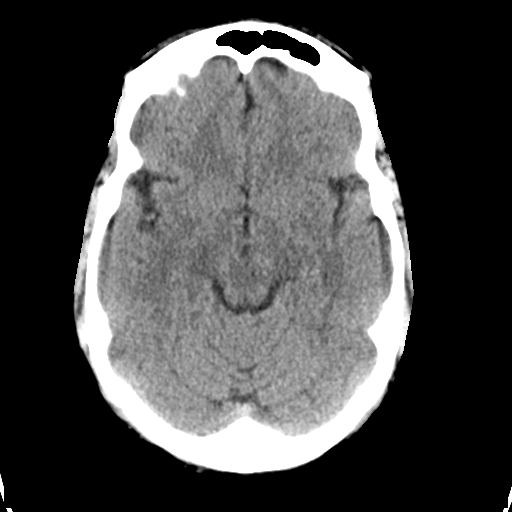
[im 13/29  brain]
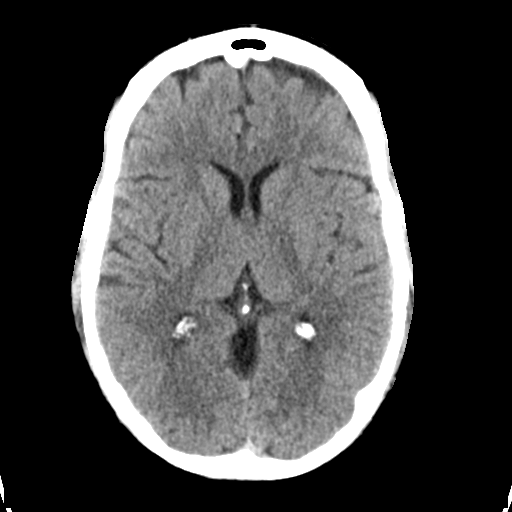
[im 17/29  brain]
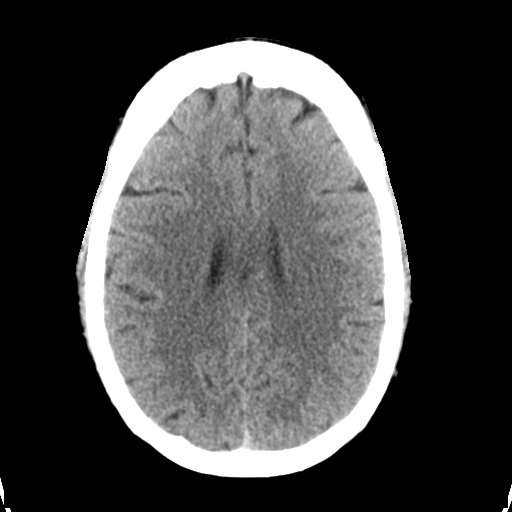
[im 17/29  bone]
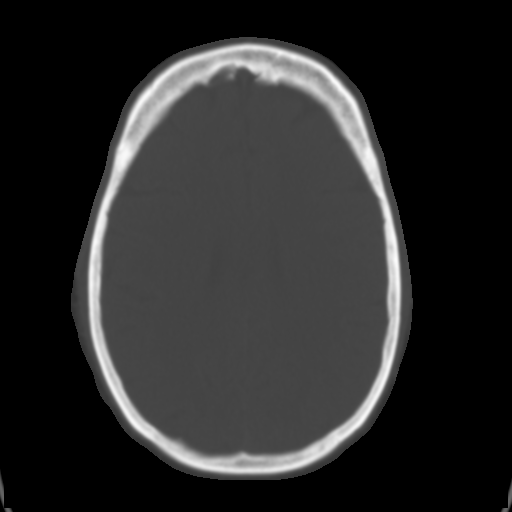
[im 20/29  brain]
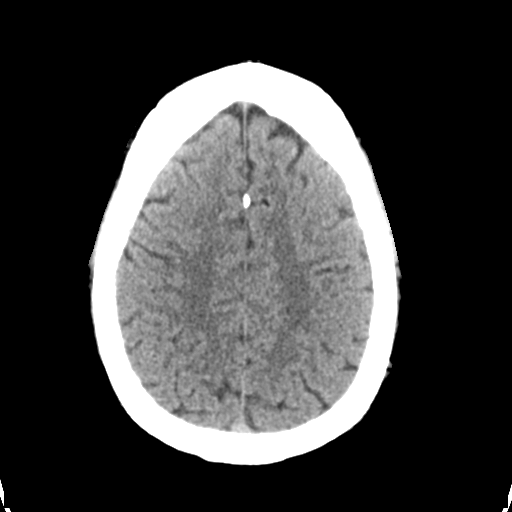
[im 23/29  brain]
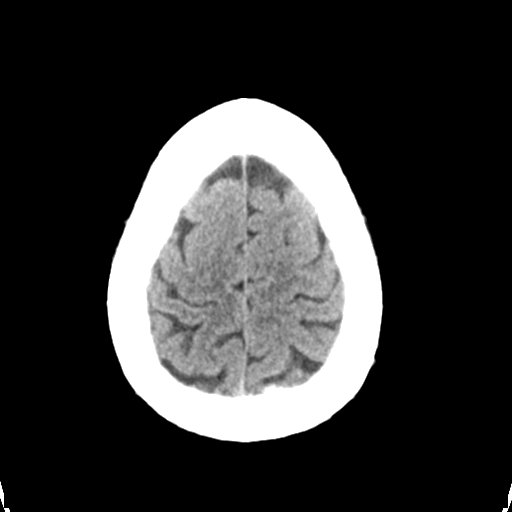
[im 27/29  brain]
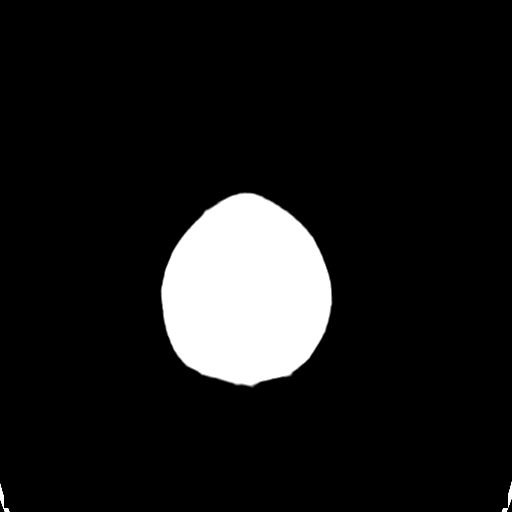

[Series 5: head 3.0 mpr cor · coronal · 0.28mm/px · 3 of 67 slices shown]
[im 23/67  brain]
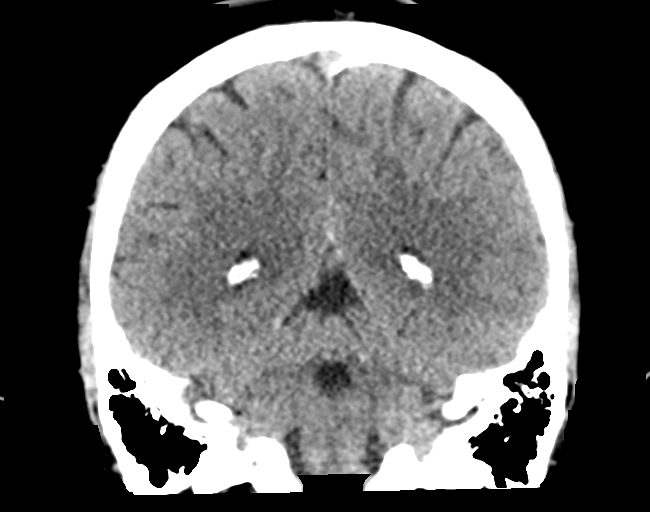
[im 30/67  brain]
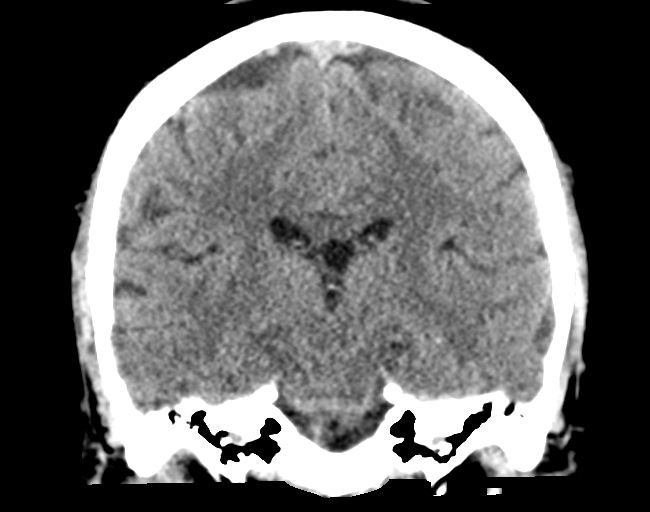
[im 37/67  brain]
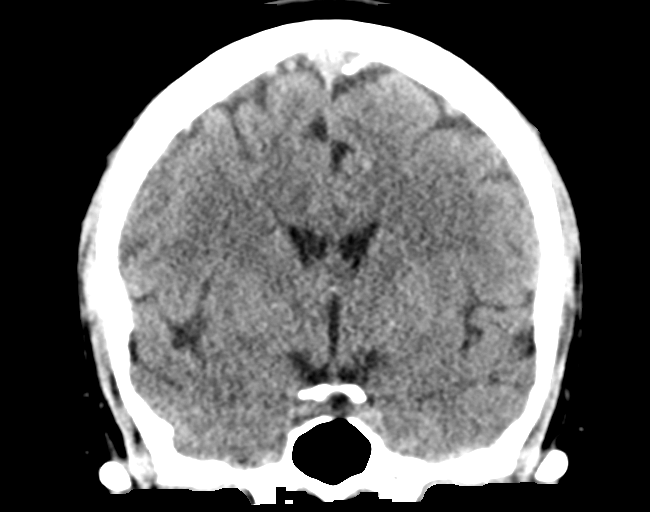

[Series 6: head 3.0 mpr sag · sagittal · 0.28mm/px · 3 of 67 slices shown]
[im 23/67  brain]
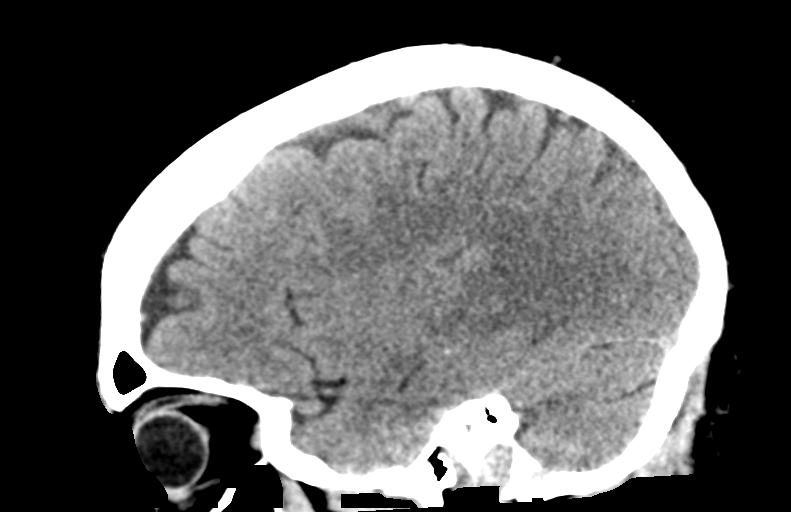
[im 34/67  brain]
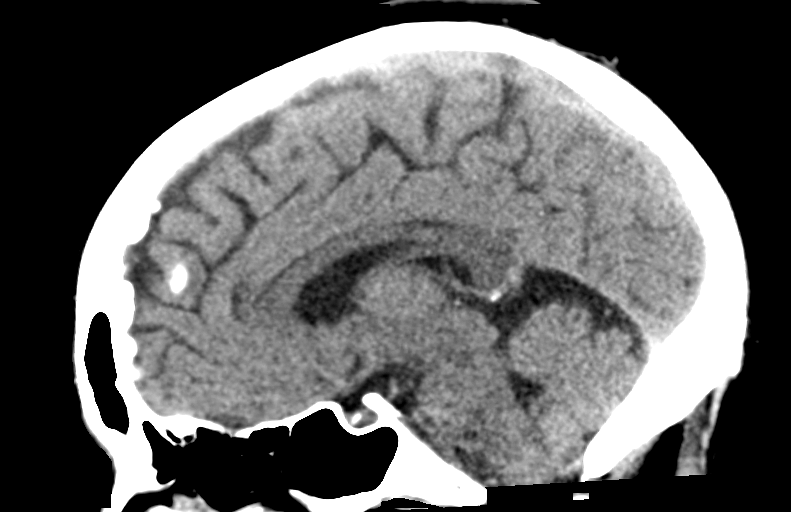
[im 45/67  brain]
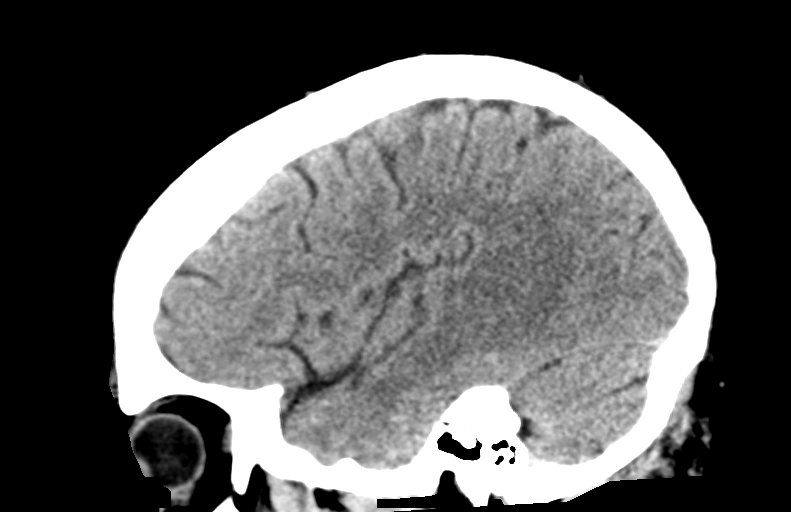

[14 of 46 positions shown; findings below may reference images not displayed]

FINDINGS: Brain: No evidence of acute infarction, hemorrhage, hydrocephalus,
extra-axial collection or mass lesion/mass effect.

Vascular: No hyperdense vessel or unexpected calcification.

Skull: Normal. Negative for fracture or focal lesion.

Sinuses/Orbits: No acute finding.

Other: None.
IMPRESSION: No acute intracranial findings.

## 2022-02-08 DIAGNOSIS — G4730 Sleep apnea, unspecified: Secondary | ICD-10-CM | POA: Insufficient documentation

## 2023-04-27 DIAGNOSIS — E1165 Type 2 diabetes mellitus with hyperglycemia: Secondary | ICD-10-CM | POA: Insufficient documentation

## 2023-05-15 ENCOUNTER — Emergency Department (HOSPITAL_COMMUNITY): Payer: Managed Care, Other (non HMO)

## 2023-05-15 ENCOUNTER — Encounter (HOSPITAL_COMMUNITY): Payer: Self-pay

## 2023-05-15 ENCOUNTER — Emergency Department (HOSPITAL_COMMUNITY)
Admission: EM | Admit: 2023-05-15 | Discharge: 2023-05-15 | Disposition: A | Discharge status: Home / Self Care | Payer: Managed Care, Other (non HMO) | Attending: Emergency Medicine | Admitting: Emergency Medicine

## 2023-05-15 ENCOUNTER — Other Ambulatory Visit: Payer: Self-pay

## 2023-05-15 DIAGNOSIS — Z720 Tobacco use: Secondary | ICD-10-CM | POA: Insufficient documentation

## 2023-05-15 DIAGNOSIS — Z7982 Long term (current) use of aspirin: Secondary | ICD-10-CM | POA: Insufficient documentation

## 2023-05-15 DIAGNOSIS — E119 Type 2 diabetes mellitus without complications: Secondary | ICD-10-CM | POA: Insufficient documentation

## 2023-05-15 DIAGNOSIS — M50122 Cervical disc disorder at C5-C6 level with radiculopathy: Secondary | ICD-10-CM | POA: Diagnosis not present

## 2023-05-15 DIAGNOSIS — Z8673 Personal history of transient ischemic attack (TIA), and cerebral infarction without residual deficits: Secondary | ICD-10-CM | POA: Insufficient documentation

## 2023-05-15 DIAGNOSIS — G510 Bell's palsy: Secondary | ICD-10-CM | POA: Diagnosis not present

## 2023-05-15 DIAGNOSIS — I10 Essential (primary) hypertension: Secondary | ICD-10-CM | POA: Insufficient documentation

## 2023-05-15 DIAGNOSIS — M5412 Radiculopathy, cervical region: Secondary | ICD-10-CM

## 2023-05-15 DIAGNOSIS — R200 Anesthesia of skin: Secondary | ICD-10-CM | POA: Diagnosis present

## 2023-05-15 LAB — DIFFERENTIAL
Abs Immature Granulocytes: 0.02 10*3/uL (ref 0.00–0.07)
Basophils Absolute: 0 10*3/uL (ref 0.0–0.1)
Basophils Relative: 1 %
Eosinophils Absolute: 0.2 10*3/uL (ref 0.0–0.5)
Eosinophils Relative: 3 %
Immature Granulocytes: 0 %
Lymphocytes Relative: 33 %
Lymphs Abs: 1.7 10*3/uL (ref 0.7–4.0)
Monocytes Absolute: 0.4 10*3/uL (ref 0.1–1.0)
Monocytes Relative: 8 %
Neutro Abs: 2.8 10*3/uL (ref 1.7–7.7)
Neutrophils Relative %: 55 %

## 2023-05-15 LAB — URINALYSIS, ROUTINE W REFLEX MICROSCOPIC
Bacteria, UA: NONE SEEN
Bilirubin Urine: NEGATIVE
Glucose, UA: NEGATIVE mg/dL
Hgb urine dipstick: NEGATIVE
Ketones, ur: NEGATIVE mg/dL
Leukocytes,Ua: NEGATIVE
Nitrite: NEGATIVE
Protein, ur: NEGATIVE mg/dL
Specific Gravity, Urine: 1.014 (ref 1.005–1.030)
pH: 5 (ref 5.0–8.0)

## 2023-05-15 LAB — CBC
HCT: 43.4 % (ref 39.0–52.0)
Hemoglobin: 15.3 g/dL (ref 13.0–17.0)
MCH: 30.8 pg (ref 26.0–34.0)
MCHC: 35.3 g/dL (ref 30.0–36.0)
MCV: 87.3 fL (ref 80.0–100.0)
Platelets: 195 10*3/uL (ref 150–400)
RBC: 4.97 MIL/uL (ref 4.22–5.81)
RDW: 12.5 % (ref 11.5–15.5)
WBC: 5.2 10*3/uL (ref 4.0–10.5)
nRBC: 0 % (ref 0.0–0.2)

## 2023-05-15 LAB — COMPREHENSIVE METABOLIC PANEL
ALT: 22 U/L (ref 0–44)
AST: 18 U/L (ref 15–41)
Albumin: 4.3 g/dL (ref 3.5–5.0)
Alkaline Phosphatase: 41 U/L (ref 38–126)
Anion gap: 7 (ref 5–15)
BUN: 16 mg/dL (ref 6–20)
CO2: 25 mmol/L (ref 22–32)
Calcium: 9.4 mg/dL (ref 8.9–10.3)
Chloride: 105 mmol/L (ref 98–111)
Creatinine, Ser: 1.07 mg/dL (ref 0.61–1.24)
GFR, Estimated: >60 mL/min (ref >60)
Glucose, Bld: 174 mg/dL — ABNORMAL HIGH (ref 70–99)
Potassium: 4.8 mmol/L (ref 3.5–5.1)
Sodium: 137 mmol/L (ref 135–145)
Total Bilirubin: 0.9 mg/dL (ref <1.2)
Total Protein: 7 g/dL (ref 6.5–8.1)

## 2023-05-15 LAB — ETHANOL: Alcohol, Ethyl (B): <10 mg/dL (ref <10)

## 2023-05-15 MED ORDER — ASPIRIN 325 MG PO TBEC: 325.0000 mg | DELAYED_RELEASE_TABLET | Freq: Every day | ORAL | 0 refills | Status: DC

## 2023-05-15 MED ORDER — CARBOXYMETHYLCELLULOSE SODIUM 1 % OP SOLN: 1.0000 [drp] | Freq: Three times a day (TID) | OPHTHALMIC | 0 refills | Status: AC | PRN

## 2023-05-15 MED ORDER — PREDNISONE 20 MG PO TABS: 60.0000 mg | ORAL_TABLET | Freq: Every day | ORAL | 0 refills | Status: AC

## 2023-05-15 MED ORDER — VALACYCLOVIR HCL 1 G PO TABS: 1000.0000 mg | ORAL_TABLET | Freq: Three times a day (TID) | ORAL | 0 refills | Status: DC

## 2023-05-15 NOTE — ED Provider Notes (Signed)
[place] EMERGENCY DEPARTMENT AT [facility] Provider Note   CSN: [id number] Arrival date & time: 05/15/23  0941     History  Chief Complaint  Patient presents with   Numbness         Brandon Rodert Kennell. is a 52 y.o. male.  Pt is a 52 yo male with pmhx significant for DM, HTN, HLD, and tobacco abuse.  Pt noticed numbness to the right side of his tongue and face starting on Saturday, 11/30.  Pt said his right eye is not closing well.  Pt does have some pain in his neck.  Pt denies any other neurologic sx.       Home Medications Prior to Admission medications   Medication Sig Start Date End Date Taking? Authorizing Provider  aspirin EC 325 MG tablet Take 1 tablet (325 mg total) by mouth daily. 05/15/23  Yes Jacalyn Lefevre, MD  carboxymethylcellulose 1 % ophthalmic solution Apply 1 drop to eye 3 (three) times daily as needed (eye dryness). 05/15/23  Yes Jacalyn Lefevre, MD  predniSONE (DELTASONE) 20 MG tablet Take 3 tablets (60 mg total) by mouth daily for 7 days. 05/15/23 05/22/23 Yes Jacalyn Lefevre, MD  valACYclovir (VALTREX) 1000 MG tablet Take 1 tablet (1,000 mg total) by mouth 3 (three) times daily. 05/15/23  Yes Jacalyn Lefevre, MD  UNABLE TO FIND Med Name: Ivan Anchors 2 tabs daily    [provider]      Allergies    Patient has no known allergies.    Review of Systems   Review of Systems  Neurological:  Positive for facial asymmetry and numbness.  All other systems reviewed and are negative.   Physical Exam Updated Vital Signs BP (!) 160/88   Pulse 69   Temp 98.4 F (36.9 C)   Resp 15   Ht 5\' 7"  (1.702 m)   Wt 100.2 kg   SpO2 100%   BMI 34.61 kg/m  Physical Exam Vitals and nursing note reviewed.  Constitutional:      Appearance: Normal appearance.  HENT:     Head: Normocephalic and atraumatic.     Right Ear: External ear normal.     Left Ear: External ear normal.     Nose: Nose normal.     Mouth/Throat:     Mouth: Mucous  membranes are moist.     Pharynx: Oropharynx is clear.  Eyes:     Extraocular Movements: Extraocular movements intact.     Conjunctiva/sclera: Conjunctivae normal.     Pupils: Pupils are equal, round, and reactive to light.  Cardiovascular:     Rate and Rhythm: Normal rate and regular rhythm.     Pulses: Normal pulses.     Heart sounds: Normal heart sounds.  Pulmonary:     Effort: Pulmonary effort is normal.     Breath sounds: Normal breath sounds.  Abdominal:     General: Abdomen is flat. Bowel sounds are normal.     Palpations: Abdomen is soft.  Musculoskeletal:        General: Normal range of motion.     Cervical back: Normal range of motion and neck supple.  Skin:    General: Skin is warm.     Capillary Refill: Capillary refill takes less than 2 seconds.  Neurological:     General: No focal deficit present.     Mental Status: He is alert and oriented to person, place, and time.     Comments: Mild right eye ptosis; weakness right  face; numbness right face and tongue  Psychiatric:        Mood and Affect: Mood normal.        Behavior: Behavior normal.    ED Results / Procedures / Treatments   Labs (all labs ordered are listed, but only abnormal results are displayed) Labs Reviewed  COMPREHENSIVE METABOLIC PANEL - Abnormal; Notable for the following components:      Result Value   Glucose, Bld 174 (*)    All other components within normal limits  ETHANOL  CBC  DIFFERENTIAL  URINALYSIS, ROUTINE W REFLEX MICROSCOPIC    EKG EKG Interpretation Date/Time:  Monday May 15 2023 10:42:03 EST Ventricular Rate:  72 PR Interval:  143 QRS Duration:  101 QT Interval:  369 QTC Calculation: 404 R Axis:   43  Text Interpretation: Sinus rhythm Consider anterior infarct No significant change since last tracing Confirmed by Jacalyn Lefevre [phone]) on 05/15/2023 12:19:16 PM  Radiology MR BRAIN WO CONTRAST  Result Date: 05/15/2023 CLINICAL DATA:  Provided history: Neuro  deficit, acute, stroke suspected. Cervical radiculopathy, no red flags. Additional history provided: Facial/tongue numbness, asymmetric smiole, pain across right side of neck. EXAM: MRI HEAD WITHOUT CONTRAST MRI CERVICAL SPINE WITHOUT CONTRAST TECHNIQUE: Multiplanar, multiecho pulse sequences of the brain and surrounding structures, and cervical spine, to include the craniocervical junction and cervicothoracic junction, were obtained without intravenous contrast. COMPARISON:  Head CT 05/15/2023. FINDINGS: MRI HEAD FINDINGS Brain: No age advanced or lobar predominant parenchymal atrophy. Multifocal T2 FLAIR hyperintense signal abnormality within the cerebral white matter, nonspecific but compatible with minimal chronic small vessel ischemic disease. Chronic lacunar infarct versus prominent perivascular space within the medial left cerebellar hemisphere (series 8, image 13). Small chronic infarct within the inferomedial left cerebellar hemisphere. No cortical encephalomalacia is identified. There is no acute infarct. No evidence of an intracranial mass. No chronic intracranial blood products. No extra-axial fluid collection. No midline shift. Vascular: Maintained flow voids within the proximal large arterial vessels. Skull and upper cervical spine: No focal worrisome marrow lesion. Sinuses/Orbits: No mass or acute finding within the imaged orbits. No significant paranasal sinus disease. MRI CERVICAL SPINE FINDINGS Alignment: No significant spondylolisthesis. Vertebrae: Cervical vertebral body height is maintained. No significant marrow edema or focal worrisome marrow lesion. Cord: No signal abnormality identified within the cervical spinal cord. Posterior Fossa, vertebral arteries, paraspinal tissues: Posterior fossa assessed on concurrently performed brain MRI. Flow voids preserved within the imaged cervical vertebral arteries. No paraspinal mass or collection. Disc levels: No more than mild disc degeneration within  the cervical spine. 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 C2-C3: Mild uncovertebral hypertrophy on the right. No significant disc herniation or stenosis. C3-C4: Disc bulge with right greater than left uncovertebral hypertrophy. The disc bulge mildly effaces the ventral thecal sac. Moderate to severe right neural foraminal narrowing. Mild left neural foraminal narrowing. C4-C5: No significant disc herniation or spinal canal stenosis. Uncovertebral hypertrophy on the right with mild right neural foraminal narrowing. C5-C6: No significant disc herniation or stenosis. C6-C7: Shallow disc bulge. Superimposed small left center disc extrusion with cranial migration to the lower C6 vertebral body level (series 2, image 10). The disc bulge and extrusion mildly efface the ventral thecal sac. The disc extrusion also mild-to-moderately narrows the left foraminal entry zone (series 5, image 31). C7-T1: No significant disc herniation or stenosis. IMPRESSION: MRI brain: 1.  No evidence of an acute intracranial abnormality. 2. Minimal chronic small vessel ischemic changes within the cerebral  white matter. 3. Chronic lacunar infarct versus prominent perivascular space within the medial left cerebellar hemisphere 4. Small chronic infarct within the inferomedial left cerebellar hemisphere (PICA territory). MRI cervical spine: 1. Cervical spondylosis as outlined within the body of the report. 2. At C6-C7, there is a shallow disc bulge. Superimposed small left center disc extrusion with cranial migration to the lower C6 vertebral body level. The disc bulge and extrusion mildly efface the ventral thecal sac. The disc extrusion also mild-to-moderately narrows the left foraminal entry zone. 3. At C3-C4, a disc bulge mildly effaces the ventral thecal sac and uncovertebral hypertrophy contributes to moderate-to-severe right neural foraminal narrowing. 4. No significant spinal canal stenosis, and no  more than mild neural foraminal narrowing, at the remaining levels. Electronically Signed   By: Jackey Loge D.O.   On: 05/15/2023 11:53   MR Cervical Spine Wo Contrast  Result Date: 05/15/2023 CLINICAL DATA:  Provided history: Neuro deficit, acute, stroke suspected. Cervical radiculopathy, no red flags. Additional history provided: Facial/tongue numbness, asymmetric smiole, pain across right side of neck. EXAM: MRI HEAD WITHOUT CONTRAST MRI CERVICAL SPINE WITHOUT CONTRAST TECHNIQUE: Multiplanar, multiecho pulse sequences of the brain and surrounding structures, and cervical spine, to include the craniocervical junction and cervicothoracic junction, were obtained without intravenous contrast. COMPARISON:  Head CT 05/15/2023. FINDINGS: MRI HEAD FINDINGS Brain: No age advanced or lobar predominant parenchymal atrophy. Multifocal T2 FLAIR hyperintense signal abnormality within the cerebral white matter, nonspecific but compatible with minimal chronic small vessel ischemic disease. Chronic lacunar infarct versus prominent perivascular space within the medial left cerebellar hemisphere (series 8, image 13). Small chronic infarct within the inferomedial left cerebellar hemisphere. No cortical encephalomalacia is identified. There is no acute infarct. No evidence of an intracranial mass. No chronic intracranial blood products. No extra-axial fluid collection. No midline shift. Vascular: Maintained flow voids within the proximal large arterial vessels. Skull and upper cervical spine: No focal worrisome marrow lesion. Sinuses/Orbits: No mass or acute finding within the imaged orbits. No significant paranasal sinus disease. MRI CERVICAL SPINE FINDINGS Alignment: No significant spondylolisthesis. Vertebrae: Cervical vertebral body height is maintained. No significant marrow edema or focal worrisome marrow lesion. Cord: No signal abnormality identified within the cervical spinal cord. Posterior Fossa, vertebral arteries,  paraspinal tissues: Posterior fossa assessed on concurrently performed brain MRI. Flow voids preserved within the imaged cervical vertebral arteries. No paraspinal mass or collection. Disc levels: No more than mild disc degeneration within the cervical spine. 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 C2-C3: Mild uncovertebral hypertrophy on the right. No significant disc herniation or stenosis. C3-C4: Disc bulge with right greater than left uncovertebral hypertrophy. The disc bulge mildly effaces the ventral thecal sac. Moderate to severe right neural foraminal narrowing. Mild left neural foraminal narrowing. C4-C5: No significant disc herniation or spinal canal stenosis. Uncovertebral hypertrophy on the right with mild right neural foraminal narrowing. C5-C6: No significant disc herniation or stenosis. C6-C7: Shallow disc bulge. Superimposed small left center disc extrusion with cranial migration to the lower C6 vertebral body level (series 2, image 10). The disc bulge and extrusion mildly efface the ventral thecal sac. The disc extrusion also mild-to-moderately narrows the left foraminal entry zone (series 5, image 31). C7-T1: No significant disc herniation or stenosis. IMPRESSION: MRI brain: 1.  No evidence of an acute intracranial abnormality. 2. Minimal chronic small vessel ischemic changes within the cerebral white matter. 3. Chronic lacunar infarct versus prominent perivascular space within the medial left cerebellar hemisphere 4.  Small chronic infarct within the inferomedial left cerebellar hemisphere (PICA territory). MRI cervical spine: 1. Cervical spondylosis as outlined within the body of the report. 2. At C6-C7, there is a shallow disc bulge. Superimposed small left center disc extrusion with cranial migration to the lower C6 vertebral body level. The disc bulge and extrusion mildly efface the ventral thecal sac. The disc extrusion also mild-to-moderately narrows the  left foraminal entry zone. 3. At C3-C4, a disc bulge mildly effaces the ventral thecal sac and uncovertebral hypertrophy contributes to moderate-to-severe right neural foraminal narrowing. 4. No significant spinal canal stenosis, and no more than mild neural foraminal narrowing, at the remaining levels. Electronically Signed   By: Jackey Loge D.O.   On: 05/15/2023 11:53   CT HEAD WO CONTRAST  Result Date: 05/15/2023 CLINICAL DATA:  Neuro deficit, acute, stroke suspected. Tongue and facial numbness with asymmetric smile. EXAM: CT HEAD WITHOUT CONTRAST TECHNIQUE: Contiguous axial images were obtained from the base of the skull through the vertex without intravenous contrast. RADIATION DOSE REDUCTION: This exam was performed according to the departmental dose-optimization program which includes automated exposure control, adjustment of the mA and/or kV according to patient size and/or use of iterative reconstruction technique. COMPARISON:  Head CT 09/21/2019 FINDINGS: Brain: There is no evidence of an acute infarct, intracranial hemorrhage, mass, midline shift, or extra-axial fluid collection. The ventricles and sulci are normal. Vascular: No hyperdense vessel. Skull: No acute fracture or suspicious osseous lesion. Sinuses/Orbits: Visualized paranasal sinuses and mastoid air cells are clear. Unremarkable orbits. Other: None. IMPRESSION: Negative head CT. Electronically Signed   By: Sebastian Ache M.D.   On: 05/15/2023 10:24    Procedures Procedures    Medications Ordered in ED Medications - No data to display  ED Course/ Medical Decision Making/ A&P                                 Medical Decision Making Amount and/or Complexity of Data Reviewed Labs: ordered. Radiology: ordered.  Risk OTC drugs. Prescription drug management.   This patient presents to the ED for concern of numbness, this involves an extensive number of treatment options, and is a complaint that carries with it a high risk of  complications and morbidity.  The differential diagnosis includes bells palsy, cva   Co morbidities that complicate the patient evaluation  DM, HTN, HLD, and tobacco abuse   Additional history obtained:  Additional history obtained from epic chart review  Lab Tests:  I Ordered, and personally interpreted labs.  The pertinent results include:  cmp nl other than glucose elevated at 174, cbc nl, etoh neg   Imaging Studies ordered:  I ordered imaging studies including ct, mri  I independently visualized and interpreted imaging which showed  CT head: Negative head CT.  I agree with the radiologist interpretation   Cardiac Monitoring:  The patient was maintained on a cardiac monitor.  I personally viewed and interpreted the cardiac monitored which showed an underlying rhythm of: nsr   Medicines ordered and prescription drug management:   I have reviewed the patients home medicines and have made adjustments as needed   Test Considered:  mri  Problem List / ED Course:  Bells Palsy:  pt's sx c/w bells, but due to multiple co-morbidities, MRI ordered. Pt does not have an acute stroke.  Pt d/c with refresh eye drops, valtrex, and prednisone.  He is told to tape his right  eye shut when he sleeps.  Chronic CVA:  He has several chronic infarcts on MRI.  He is strongly encouraged to stop smoking.  He is told to start taking an asa a day.  He does not check his bs.  He is told to check it regularly and to eat a healthy diet.  He is to f/u with neuro.  Neck pain:  possibly from c6-c7 pinched nerve.  Steroids will help.  He is given exercises.    Reevaluation:  After the interventions noted above, I reevaluated the patient and found that they have :improved   Social Determinants of Health:  Lives at home   Dispostion:  After consideration of the diagnostic results and the patients response to treatment, I feel that the patent would benefit from discharge with outpatient  f/u.          Final Clinical Impression(s) / ED Diagnoses Final diagnoses:  Bell's palsy  Tobacco abuse  Chronic cerebrovascular accident (CVA)  Cervical radiculopathy at C6    Rx / DC Orders ED Discharge Orders          Ordered    predniSONE (DELTASONE) 20 MG tablet  Daily        05/15/23 1214    valACYclovir (VALTREX) 1000 MG tablet  3 times daily        05/15/23 1214    carboxymethylcellulose 1 % ophthalmic solution  3 times daily PRN        05/15/23 1214    Ambulatory referral to Neurology       Comments: An appointment is requested in approximately: 1 week   05/15/23 1215    aspirin EC 325 MG tablet  Daily        05/15/23 1217              Jacalyn Lefevre, MD 05/15/23 1223

## 2023-05-15 NOTE — Discharge Instructions (Addendum)
It is very important that you try to stop smoking.  The steroids can increase your blood sugar levels.  Pay close attention to your diet and check your BS frequently.  Start taking an Aspirin to prevent further strokes.

## 2023-05-15 NOTE — ED Notes (Signed)
Patient transported to CT 

## 2023-05-15 NOTE — ED Notes (Signed)
Patient transported to MRI 

## 2023-05-15 NOTE — ED Triage Notes (Addendum)
Pt noticed Wednesday that he had numbness on his tongue. Since then the numbness had increased on his face, and noticed that he now has a asymmetric smile that began Saturday. Endorses some pain across the right side of his neck. 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

## 2023-07-12 ENCOUNTER — Telehealth: Payer: Self-pay | Admitting: Diagnostic Neuroimaging

## 2023-07-12 NOTE — Telephone Encounter (Signed)
Pt called to verify appointment

## 2023-07-13 ENCOUNTER — Encounter: Payer: Self-pay | Admitting: *Deleted

## 2023-07-14 ENCOUNTER — Ambulatory Visit: Payer: Managed Care, Other (non HMO) | Admitting: Diagnostic Neuroimaging

## 2023-07-14 ENCOUNTER — Encounter: Payer: Self-pay | Admitting: Diagnostic Neuroimaging

## 2023-07-14 VITALS — BP 135/82 | HR 67 | Ht 67.0 in | Wt 226.6 lb

## 2023-07-14 DIAGNOSIS — G510 Bell's palsy: Secondary | ICD-10-CM

## 2023-07-14 NOTE — Progress Notes (Signed)
[facility]  PATIENT: Brandon James. DOB: 1970/09/01  REFERRING CLINICIAN: Jacalyn Lefevre, MD HISTORY FROM: patient  REASON FOR VISIT: new consult   HISTORICAL  CHIEF COMPLAINT:  Chief Complaint  Patient presents with   New Patient (Initial Visit)    Pt in room 7 alone. New patient here for Chronic CVA. CT and MRI imaging in epic. Pt said facial numbness is gone. Pt reports doing well.     HISTORY OF PRESENT ILLNESS:   53 year old male here for evaluation of Bell's palsy and abnormal MRI.  05/15/2023 patient presented to ER for right sided facial weakness.  Symptoms started a couple days earlier.  He was diagnosed with right Bell's palsy, treated with valacyclovir and prednisone.  Symptoms resolved within 3 weeks.  During course of workup he had MRI of the brain which showed some small vessel disease and chronic infarcts.  He was recommended follow-up with neurology.  No prior strokelike symptoms.  No unilateral numbness, weakness, slurred speech, vision loss.  History of hypertension, diabetes, hypercholesterolemia, smoking, on medical management.   REVIEW OF SYSTEMS: Full 14 system review of systems performed and negative with exception of: as per HPI.  ALLERGIES: No Known Allergies  HOME MEDICATIONS: Outpatient Medications Prior to Visit  Medication Sig Dispense Refill   aspirin 81 MG chewable tablet Chew by mouth daily.     carboxymethylcellulose 1 % ophthalmic solution Apply 1 drop to eye 3 (three) times daily as needed (eye dryness). 15 mL 0   lisinopril (ZESTRIL) 10 MG tablet Take by mouth daily.     MOUNJARO 2.5 MG/0.5ML Pen INJECT 2.5 MG UNDER THE SKIN ONCE WEEKLY     rosuvastatin (CRESTOR) 10 MG tablet Take 1 tablet by mouth daily.     UNABLE TO FIND Med Name: Ivan Anchors 2 tabs daily     aspirin EC 325 MG tablet Take 1 tablet (325 mg total) by mouth daily. (Patient not taking: Reported on 07/14/2023) 30 tablet 0   atorvastatin (LIPITOR)  20 MG tablet Take 1 tablet by mouth daily. (Patient not taking: Reported on 07/14/2023)     lisinopril (ZESTRIL) 5 MG tablet Take 5 mg by mouth daily. (Patient not taking: Reported on 07/14/2023)     valACYclovir (VALTREX) 1000 MG tablet Take 1 tablet (1,000 mg total) by mouth 3 (three) times daily. (Patient not taking: Reported on 07/14/2023) 21 tablet 0   No facility-administered medications prior to visit.    PAST MEDICAL HISTORY: Past Medical History:  Diagnosis Date   Diabetes mellitus without complication (HCC)    High cholesterol    Hypertension     PAST SURGICAL HISTORY: Past Surgical History:  Procedure Laterality Date   TONSILLECTOMY      FAMILY HISTORY: Family History  Problem Relation Age of Onset   Colon polyps Mother    Colon cancer Neg Hx    Esophageal cancer Neg Hx    Rectal cancer Neg Hx    Stomach cancer Neg Hx     SOCIAL HISTORY: Social History   Socioeconomic History   Marital status: Single    Spouse name: Not on file   Number of children: 1   Years of education: Not on file   Highest education level: Not on file  Occupational History   Not on file  Tobacco Use   Smoking status: Every Day    Current packs/day: 10.00    Types: Cigars, Cigarettes   Smokeless tobacco: Never  Vaping Use   Vaping  status: Never Used  Substance and Sexual Activity   Alcohol use: Not Currently    Comment: occ wine    Drug use: No   Sexual activity: Yes  Other Topics Concern   Not on file  Social History Narrative   Right handed    Wears glasses   Drinks coffee daily   Social Drivers of Corporate investment banker Strain: Not on file  Food Insecurity: Not on file  Transportation Needs: Not on file  Physical Activity: Not on file  Stress: Not on file  Social Connections: Not on file  Intimate Partner Violence: Not on file     PHYSICAL EXAM  GENERAL EXAM/CONSTITUTIONAL: Vitals:  Vitals:   07/14/23 1016  BP: 135/82  Pulse: 67  Weight: 226 lb 9.6  oz (102.8 kg)  Height: 5\' 7"  (1.702 m)   Body mass index is 35.49 kg/m. Wt Readings from Last 3 Encounters:  07/14/23 226 lb 9.6 oz (102.8 kg)  05/15/23 221 lb (100.2 kg)  09/21/19 228 lb (103.4 kg)   Patient is in no distress; well developed, nourished and groomed; neck is supple  CARDIOVASCULAR: Examination of carotid arteries is normal; no carotid bruits Regular rate and rhythm, no murmurs Examination of peripheral vascular system by observation and palpation is normal  EYES: Ophthalmoscopic exam of optic discs and posterior segments is normal; no papilledema or hemorrhages No results found.  MUSCULOSKELETAL: Gait, strength, tone, movements noted in Neurologic exam below  NEUROLOGIC: MENTAL STATUS:      No data to display         awake, alert, oriented to person, place and time recent and remote memory intact normal attention and concentration language fluent, comprehension intact, naming intact fund of knowledge appropriate  CRANIAL NERVE:  2nd - no papilledema on fundoscopic exam 2nd, 3rd, 4th, 6th - pupils equal and reactive to light, visual fields full to confrontation, extraocular muscles intact, no nystagmus 5th - facial sensation symmetric 7th - facial strength symmetric 8th - hearing intact 9th - palate elevates symmetrically, uvula midline 11th - shoulder shrug symmetric 12th - tongue protrusion midline  MOTOR:  normal bulk and tone, full strength in the BUE, BLE  SENSORY:  normal and symmetric to light touch, temperature, vibration  COORDINATION:  finger-nose-finger, fine finger movements normal  REFLEXES:  deep tendon reflexes present and symmetric  GAIT/STATION:  narrow based gait     DIAGNOSTIC DATA (LABS, IMAGING, TESTING) - I reviewed patient records, labs, notes, testing and imaging myself where available.  Lab Results  Component Value Date   WBC 5.2 05/15/2023   HGB 15.3 05/15/2023   HCT 43.4 05/15/2023   MCV 87.3  05/15/2023   PLT 195 05/15/2023      Component Value Date/Time   NA 137 05/15/2023 1025   K 4.8 05/15/2023 1025   CL 105 05/15/2023 1025   CO2 25 05/15/2023 1025   GLUCOSE 174 (H) 05/15/2023 1025   BUN 16 05/15/2023 1025   CREATININE 1.07 05/15/2023 1025   CALCIUM 9.4 05/15/2023 1025   PROT 7.0 05/15/2023 1025   ALBUMIN 4.3 05/15/2023 1025   AST 18 05/15/2023 1025   ALT 22 05/15/2023 1025   ALKPHOS 41 05/15/2023 1025   BILITOT 0.9 05/15/2023 1025   GFRNONAA >60 05/15/2023 1025   GFRAA >60 09/21/2019 1853   Lab Results  Component Value Date   CHOL 158 12/15/2010   HDL 31 (L) 12/15/2010   LDLCALC 89 12/15/2010   TRIG 189 (H) 12/15/2010  CHOLHDL 5.1 12/15/2010   Lab Results  Component Value Date   HGBA1C 6.6 (H) 12/15/2010   No results found for: "VITAMINB12" Lab Results  Component Value Date   TSH 3.879 12/15/2010   Component Ref Range & Units 3 mo ago Comments  Hemoglobin A1c <5.7 % 8.2 High     Component Ref Range & Units 2 mo ago Comments  Cholesterol, Total, Lipid Panel <200 mg/dL 960 NCEP III Guidelines  Total Cholesterol             Risk Classification                         <200 mg/dL                      Desirable   200-239 mg/dL                   Borderline High   >240 mg/dL                      High  Triglycerides, Lipid Panel <150 mg/dL 454 High  NCEP-ATP III Guidelines   Triglyceride Value           Risk Classification                         <150 mg/dL                   Normal   150-199 mg/dL                Borderline High   200-499 mg/dL                High   >=098 mg/dL                  Very High  HDL Cholesterol - Lipid Panel >=60 mg/dL 37 Low  NCEP III Guidelines   HDLc                     Risk Classification                         >=60 mg/dL                  Optimal   >=40 mg/dL                  Desirable   <40 mg/dL                   Low    LDL Cholesterol, Calculated <100 mg/dL 119 High  LDL Cholesterol is calculated using  the Martin/Hopkins equation. Source:  JAMA 2013; 310:  2061-68. NCEP-ATP III Guidelines   LDLc                     Risk Classification                         <100 mg/dL                   Optimal   100-129 mg/dL                Desirable   130-159 mg/dL                Borderline High  160-189 mg/dL                High   >657 mg/dL                   Very High  Non-HDL Cholesterol mg/dL 846      96/2/95 MRI brain: [I reviewed images myself. Findings more likely benign perivascular spaces than infacts. -VRP]  1.  No evidence of an acute intracranial abnormality. 2. Minimal chronic small vessel ischemic changes within the cerebral white matter. 3. Chronic lacunar infarct versus prominent perivascular space within the medial left cerebellar hemisphere 4. Small chronic infarct within the inferomedial left cerebellar hemisphere (PICA territory).   05/15/23 MRI cervical spine: 1. Cervical spondylosis as outlined within the body of the report. 2. At C6-C7, there is a shallow disc bulge. Superimposed small left center disc extrusion with cranial migration to the lower C6 vertebral body level. The disc bulge and extrusion mildly efface the ventral thecal sac. The disc extrusion also mild-to-moderately narrows the left foraminal entry zone. 3. At C3-C4, a disc bulge mildly effaces the ventral thecal sac and uncovertebral hypertrophy contributes to moderate-to-severe right neural foraminal narrowing. 4. No significant spinal canal stenosis, and no more than mild neural foraminal narrowing, at the remaining levels.    ASSESSMENT AND PLAN  53 y.o. year old male here with:  Dx:  1. Right-sided Bell's palsy     PLAN:  Right bell's palsy - 05/15/23, s/p valcyclovir and prednisone, with excellent resolution of symptoms  MRI brain findings - minimal T2 hyperintensities, could be minimal chronic small vessel ischemic disease; no definite infarcts; more likely enlarged peri-vascular  spaces - continue vascular risk factor mgmt (DM, BP, lipids, smoking cessation) - no definite indication for aspirin from stroke prevention standpoint, as this would be primary prevention  Return for pending if symptoms worsen or fail to improve, return to PCP.    Suanne Marker, MD 07/14/2023, 11:14 AM Certified in Neurology, Neurophysiology and Neuroimaging  [facility] [address], [address] [place], [place] [address] [phone]

## 2023-07-14 NOTE — Patient Instructions (Addendum)
  Right bell's palsy - 05/15/23, s/p valcyclovir and prednisone, with excellent resolution of symptoms  MRI brain findings - minimal T2 hyperintensities, could be minimal chronic small vessel ischemic disease; no definite infarcts; more likely enlarged peri-vascular spaces - continue vascular risk factor mgmt (DM, BP, lipids, smoking cessation) - no definite indication for aspirin from stroke prevention standpoint, as this would be primary prevention

## 2024-05-20 VITALS — BP 133/79 | HR 81 | Temp 98.5°F | Resp 16 | Wt 190.0 lb

## 2024-05-20 DIAGNOSIS — L989 Disorder of the skin and subcutaneous tissue, unspecified: Secondary | ICD-10-CM | POA: Diagnosis not present

## 2024-05-20 MED ORDER — SULFAMETHOXAZOLE-TRIMETHOPRIM 800-160 MG PO TABS: 1.0000 | ORAL_TABLET | Freq: Two times a day (BID) | ORAL | 0 refills | Status: AC

## 2024-05-20 NOTE — ED Triage Notes (Signed)
 Pt presents c/o head pain x 14 days. Pt states,  I have a bump on back of my head. I told my primary about it last Monday. She told me to come to Urgent Care if it did not get better. I had my wife burst it and blood came out with just a little puss. Now it has came right back. It's tender to touch in the area around the knot.
